# Patient Record
Sex: Female | Born: 1945
Health system: Southern US, Community
[De-identification: ages and names within clinical notes are randomized; demographics above are authoritative.]

## PROBLEM LIST (undated history)

## (undated) DIAGNOSIS — H3581 Retinal edema: Secondary | ICD-10-CM

## (undated) DIAGNOSIS — I10 Essential (primary) hypertension: Secondary | ICD-10-CM

## (undated) DIAGNOSIS — M810 Age-related osteoporosis without current pathological fracture: Secondary | ICD-10-CM

## (undated) HISTORY — PX: APPENDECTOMY: SHX54

## (undated) HISTORY — PX: ABDOMINAL HYSTERECTOMY: SHX81

---

## 2005-01-07 ENCOUNTER — Emergency Department (HOSPITAL_COMMUNITY): Admission: EM | Admit: 2005-01-07 | Discharge: 2005-01-07 | Payer: Self-pay | Admitting: Family Medicine

## 2007-05-27 ENCOUNTER — Encounter: Admission: RE | Admit: 2007-05-27 | Discharge: 2007-05-27 | Payer: Self-pay | Admitting: Internal Medicine

## 2007-12-24 HISTORY — PX: ABDOMINAL ADHESION SURGERY: SHX90

## 2008-04-30 ENCOUNTER — Inpatient Hospital Stay (HOSPITAL_COMMUNITY): Admission: EM | Admit: 2008-04-30 | Discharge: 2008-05-09 | Payer: Self-pay | Admitting: Emergency Medicine

## 2008-10-18 ENCOUNTER — Inpatient Hospital Stay (HOSPITAL_COMMUNITY): Admission: EM | Admit: 2008-10-18 | Discharge: 2008-10-20 | Payer: Self-pay | Admitting: *Deleted

## 2009-04-06 ENCOUNTER — Encounter: Admission: RE | Admit: 2009-04-06 | Discharge: 2009-04-06 | Payer: Self-pay | Admitting: Internal Medicine

## 2010-04-11 ENCOUNTER — Encounter: Admission: RE | Admit: 2010-04-11 | Discharge: 2010-04-11 | Payer: Self-pay | Admitting: Internal Medicine

## 2010-11-12 ENCOUNTER — Ambulatory Visit (HOSPITAL_COMMUNITY)
Admission: RE | Admit: 2010-11-12 | Discharge: 2010-11-12 | Payer: Self-pay | Source: Home / Self Care | Admitting: Internal Medicine

## 2011-04-03 ENCOUNTER — Other Ambulatory Visit: Payer: Self-pay | Admitting: Internal Medicine

## 2011-04-03 DIAGNOSIS — Z1231 Encounter for screening mammogram for malignant neoplasm of breast: Secondary | ICD-10-CM

## 2011-04-17 ENCOUNTER — Ambulatory Visit
Admission: RE | Admit: 2011-04-17 | Discharge: 2011-04-17 | Disposition: A | Discharge status: Home / Self Care | Payer: 59 | Source: Ambulatory Visit | Attending: Internal Medicine | Admitting: Internal Medicine

## 2011-04-17 DIAGNOSIS — Z1231 Encounter for screening mammogram for malignant neoplasm of breast: Secondary | ICD-10-CM

## 2011-05-07 NOTE — Op Note (Signed)
NAME:  [name], KUTSCH NO.:  [id number]   MEDICAL RECORD NO.:  [id number]          PATIENT TYPE:  INP   LOCATION:  1537                         FACILITY:  [facility]   PHYSICIAN:  Alfonse Ras, MD   DATE OF BIRTH:  1946-01-19   DATE OF PROCEDURE:  04/30/2008  DATE OF DISCHARGE:                               OPERATIVE REPORT   PREOPERATIVE DIAGNOSES:  1. Small bowel obstruction.  2. History of malrotations.   POSTOPERATIVE DIAGNOSES:  1. Small bowel obstruction.  2. History of malrotations.  3. Adhesions.   PROCEDURES:  Exploratory laparotomy and lysis of adhesions.   SURGEON:  Baruch Merl, MD   ANESTHESIA:  General.   ESTIMATED BLOOD LOSS:  50 mL or less.   COMPLICATIONS:  None.   SPECIMENS:  None.   DESCRIPTION:  The patient was taken to the operating room and placed in  supine position.  After adequate anesthesia was induced using  endotracheal tube, the abdomen was prepped and draped in normal sterile  fashion.  Using a vertical midline incision near the umbilicus, I  dissected down to fascia.  This was opened vertically.  Peritoneum was  entered.  There were virtually no adhesions to the anterior abdominal  wall.  There was a large loop of dilated bowel in the right lower  quadrant which was mobilized and a single thick adhesive band was lysed.  There were number of other adhesions between small bowel loops.  The  entire small bowel was run.  The duodenum, of note, was in the midline.  All of the small bowel was in the right abdomen and the majority of the  colon was in the left upper quadrant and left abdomen.  No other twist  in the mesentery or mesenteric defects were seen.  This accomplished  complete relief of the obstruction.  A few small venous bleeders were  oversewn with 2-0 silk ligatures.  The abdomen was copiously irrigated.  The fascia was closed with running #1 Novofil.  Skin was closed with  staples.  The patient tolerated the  procedure well, went to PACU in good  condition.     Alfonse Ras, MD  Electronically Signed    KRE/MEDQ  D:  04/30/2008  T:  05/01/2008  Job:  [id number]

## 2011-05-07 NOTE — H&P (Signed)
NAME:  [name], [name] NO.:  [id number]   MEDICAL RECORD NO.:  [id number]          PATIENT TYPE:  EMS   LOCATION:  ED                           FACILITY:  [facility]   PHYSICIAN:  Allison Ras, MD   DATE OF BIRTH:  08-13-1946   DATE OF ADMISSION:  10/18/2008  DATE OF DISCHARGE:                              HISTORY & PHYSICAL   ADMISSION DIAGNOSIS:  Partial small bowel obstruction, history of midgut  malrotation.   ADMITTING PHYSICIAN:  [name], M.D.   HISTORY OF PRESENT ILLNESS:  The patient is a very pleasant 65 year old  white female with about a 24-hour history of vague, right upper quadrant  and left lower quadrant abdominal discomfort, some mild distention, and  nausea and vomiting last night.  She is seen in emergency room,  underwent a KUB which was consistent with partial small-bowel  obstruction.  The patient has been having bowel movements although not  completely normal over the last number of days and continues to pass  gas.   PAST MEDICAL HISTORY:  Significant for hypertension and osteoporosis.   MEDICATIONS:  Norvasc 2.5 mg a day, Micardis 80/12.5 mg a day.   REVIEW OF SYSTEMS:  Significant as above.   PAST SURGICAL HISTORY:  Significant for exploratory surgery number of  years ago where she was told that she had malrotation and then most  recently in May of this year by myself for lysis of adhesions.   PHYSICAL EXAMINATION:  GENERAL:  On physical exam, she is an age-  appropriate white female in no distress.  VITAL SIGNS:  Her blood  pressure is 136/80, heart rate is 105, and respiratory rate is 16.  HEENT EXAM:  Benign.  Normocephalic, atraumatic.  ABDOMINAL EXAM:  Soft,  mildly distended, and somewhat tympanitic, but really not tender.  EXTREMITY EXAM:  No clubbing, cyanosis, or edema.  Rectal exam is  deferred.   LABORATORY DATA:  White count of 17,000.  C-Met is within normal limits  except for chloride of 90 and glucose of  177.   IMPRESSION:  Partial small-bowel obstruction.   PLAN:  Admission, IV hydration and NG tube decompression and follow  serial exams and KUB in the morning.      Allison Ras, MD  Electronically Signed    KRE/MEDQ  D:  10/18/2008  T:  10/18/2008  Job:  [id number]

## 2011-05-07 NOTE — Discharge Summary (Signed)
NAME:  [name], [name] NO.:  [id number]   MEDICAL RECORD NO.:  [id number]          PATIENT TYPE:  INP   LOCATION:  1537                         FACILITY:  [facility]   PHYSICIAN:  Alfonse Ras, MD   DATE OF BIRTH:  1946-01-12   DATE OF ADMISSION:  04/30/2008  DATE OF DISCHARGE:  05/09/2008                               DISCHARGE SUMMARY   ADMISSION DIAGNOSIS:  Small bowel obstruction.   DISCHARGE DIAGNOSIS:  Small bowel obstruction.   CONDITION ON DISCHARGE:  Good and improved.   DISPOSITION:  Discharged to home.   MEDICATIONS:  1. Vicodin for pain p.r.n.  2. Protonix 40 mg 1 orally everyday.  3. Norvasc 2.5 mg a day.  4. Micardis 80 mg a day.   HOSPITAL COURSE:  The patient was admitted with a small bowel  obstruction, secondary to adhesions.  She was taken to the operating  room and underwent lysis of adhesions.  Postoperatively, she did well  and took number of days for ileus to resolve.  However, by postoperative  day #7, she was tolerating a regular diet having normal bowel movements.  Incision was well healed.  Staples were removed and she was ready for  discharge home.      Alfonse Ras, MD  Electronically Signed     KRE/MEDQ  D:  05/09/2008  T:  05/09/2008  Job:  [id number]   cc:   Jola Babinski, M.D.

## 2011-05-07 NOTE — H&P (Signed)
NAME:  [name], [name] NO.:  [id number]   MEDICAL RECORD NO.:  [id number]          PATIENT TYPE:  EMS   LOCATION:  ED                           [facility]ACILITY:  [facility]   PHYSICIAN:  Alfonse Ras, MD   DATE OF BIRTH:  [birth year]   DATE OF ADMISSION:  04/30/2008  DATE OF DISCHARGE:                              HISTORY & PHYSICAL   ADMISSION DIAGNOSES:  Small bowel obstruction, history of midgut  malrotation.   ADMITTING PHYSICIAN:  Alfonse Ras, MD   HISTORY OF PRESENT ILLNESS:  The patient is a very pleasant 65 year old  white female, wife of Dr. Syliva Overman, with 5-day history of vague  abdominal pain, nausea without emesis, and last bowel movement was 4  days ago.  She has been complaining of obstipation over the last 3 days.  She has significant anorexia.  Her significant medical history is that  of an emergency hysterectomy in 1981 and apparent fixation of her  malrotation in 24 in [place], [place].  The patient has had no prior  episodes of small bowel obstruction since 1984.  She denies fever or  chills.   PAST MEDICAL HISTORY:  Is significant for hypertension and osteoporosis.   MEDICATIONS:  Include Norvasc at 2.5 mg a day and Micardis 80/12.5 mg  p.o. daily.   REVIEW OF SYSTEMS:  Significant as above.   PHYSICAL EXAMINATION:  On physical exam, she is an age-appropriate white  female in no distress.  She is accompanied by her husband.  Blood  pressure is 137/79, heart rate is 107, respiratory rate is 20.  HEENT EXAM:  Benign.  Normocephalic and atraumatic.  Pupils are equal,  round, reactive to light.  ABDOMEN:  Soft but somewhat distended and tympanitic.  She is tender in  the right lower quadrant without significant rebound.  EXTREMITIES:  Show no clubbing, cyanosis or edema.  RECTAL EXAM:  Deferred.   CT scan of the abdomen shows a swirl sign of the mesentery and multiple  dilated loops of small bowel.  All of the colon was on the left  side of  the abdomen and the duodenum does not slip across ligament of Treitz,  all consistent with malrotation and small bowel obstruction.   LABS:  White count is 11.6, otherwise electrolytes are within normal  limits except for a sodium of 129 and a glucose of 147.   IMPRESSION:  Small bowel obstruction most likely secondary to adhesions  with a history of malrotation.   PLAN:  Admission, IV hydration, exploratory laparotomy with possible  small bowel resection and possible colostomy.   I explained the risks, benefits and options to the patient and her  husband including the possibility of colostomy and resection and length  of stay in the hospital.  We will proceed with this as an open  exploratory laparotomy.  All the patient's questions are answered and  they wish to proceed.      Alfonse Ras, MD  Electronically Signed     KRE/MEDQ  D:  04/30/2008  T:  05/01/2008  Job:  [id number]

## 2011-05-07 NOTE — Discharge Summary (Signed)
NAME:  [name], POLIO NO.:  [id number]   MEDICAL RECORD NO.:  [id number]          PATIENT TYPE:  INP   LOCATION:  1312                         [facility]AC[facility]   PHYSICIAN:  Alfonse Ras, MD   DATE OF BIRTH:  [birth year]   DATE OF ADMISSION:  10/18/2008  DATE OF DISCHARGE:  10/20/2008                               DISCHARGE SUMMARY   ADMISSION DIAGNOSIS:  Partial small bowel obstruction.   FINAL DIAGNOSIS:  Partial small bowel obstruction resolved with  conservative management.   HOSPITAL COURSE:  The patient is a 65 year old white female known to me  in the past for lysis of adhesions in May of this year who presented  with partial small bowel obstruction.  She was admitted, NG tube was  placed and within 12-14 hours NG tube had put out minimal amounts.  The  patient was passing gas.  Her abdomen was much, much softer.  NG tube  was removed.  She was started on clear liquids which she tolerated well  for over 24 hours.  Had multiple small bowel movements and was ready for  discharge home.  There is no plan for followup with me.  She can call me  p.r.n.      Alfonse Ras, MD  Electronically Signed     KRE/MEDQ  D:  10/20/2008  T:  10/20/2008  Job:  [id number]   cc:   Soyla Murphy. Renne Crigler, M.D.  Fax: [phone]

## 2011-09-18 LAB — BASIC METABOLIC PANEL
CO2: 26
Calcium: 8.3 — ABNORMAL LOW
Glucose, Bld: 117 — ABNORMAL HIGH
Sodium: 130 — ABNORMAL LOW

## 2011-09-18 LAB — URINALYSIS, ROUTINE W REFLEX MICROSCOPIC
Bilirubin Urine: NEGATIVE
Hgb urine dipstick: NEGATIVE
Ketones, ur: NEGATIVE
Protein, ur: NEGATIVE
Urobilinogen, UA: 1

## 2011-09-18 LAB — CBC
HCT: 31.1 — ABNORMAL LOW
Hemoglobin: 10.6 — ABNORMAL LOW
MCHC: 34
RDW: 12.9

## 2011-09-18 LAB — URINE CULTURE

## 2011-09-23 LAB — URINALYSIS, ROUTINE W REFLEX MICROSCOPIC
Glucose, UA: NEGATIVE
Ketones, ur: 40 — AB
Protein, ur: 30 — AB

## 2011-09-23 LAB — DIFFERENTIAL
Basophils Relative: 0
Eosinophils Absolute: 0
Eosinophils Relative: 0
Monocytes Relative: 3
Neutrophils Relative %: 94 — ABNORMAL HIGH

## 2011-09-23 LAB — COMPREHENSIVE METABOLIC PANEL
ALT: 37 — ABNORMAL HIGH
AST: 41 — ABNORMAL HIGH
Alkaline Phosphatase: 79
CO2: 32
GFR calc non Af Amer: >60
Glucose, Bld: 177 — ABNORMAL HIGH
Potassium: 3.7
Sodium: 136
Total Protein: 8.2

## 2011-09-23 LAB — BASIC METABOLIC PANEL
Calcium: 7.9 — ABNORMAL LOW
Creatinine, Ser: 0.52
GFR calc Af Amer: >60
GFR calc non Af Amer: >60
Sodium: 135

## 2011-09-23 LAB — CBC
Hemoglobin: 10.8 — ABNORMAL LOW
Hemoglobin: 15
RBC: 3.3 — ABNORMAL LOW
RBC: 4.62
WBC: 4.7

## 2011-09-23 LAB — URINE MICROSCOPIC-ADD ON

## 2011-09-23 LAB — AMYLASE: Amylase: 52

## 2012-05-13 ENCOUNTER — Other Ambulatory Visit: Payer: Self-pay | Admitting: Internal Medicine

## 2012-05-13 DIAGNOSIS — Z1231 Encounter for screening mammogram for malignant neoplasm of breast: Secondary | ICD-10-CM

## 2012-06-01 ENCOUNTER — Ambulatory Visit
Admission: RE | Admit: 2012-06-01 | Discharge: 2012-06-01 | Disposition: A | Discharge status: Home / Self Care | Payer: Medicare Other | Source: Ambulatory Visit | Attending: Internal Medicine | Admitting: Internal Medicine

## 2012-06-01 DIAGNOSIS — Z1231 Encounter for screening mammogram for malignant neoplasm of breast: Secondary | ICD-10-CM

## 2013-05-28 ENCOUNTER — Other Ambulatory Visit: Payer: Self-pay

## 2013-05-28 DIAGNOSIS — Z1231 Encounter for screening mammogram for malignant neoplasm of breast: Secondary | ICD-10-CM

## 2013-06-24 ENCOUNTER — Ambulatory Visit
Admission: RE | Admit: 2013-06-24 | Discharge: 2013-06-24 | Disposition: A | Discharge status: Home / Self Care | Payer: Medicare Other | Source: Ambulatory Visit

## 2013-06-24 DIAGNOSIS — Z1231 Encounter for screening mammogram for malignant neoplasm of breast: Secondary | ICD-10-CM

## 2014-07-11 ENCOUNTER — Other Ambulatory Visit: Payer: Self-pay

## 2014-07-11 DIAGNOSIS — Z1231 Encounter for screening mammogram for malignant neoplasm of breast: Secondary | ICD-10-CM

## 2014-07-19 ENCOUNTER — Encounter (INDEPENDENT_AMBULATORY_CARE_PROVIDER_SITE_OTHER): Payer: Self-pay

## 2014-07-19 ENCOUNTER — Ambulatory Visit
Admission: RE | Admit: 2014-07-19 | Discharge: 2014-07-19 | Disposition: A | Discharge status: Home / Self Care | Payer: Medicare Other | Source: Ambulatory Visit

## 2014-07-19 DIAGNOSIS — Z1231 Encounter for screening mammogram for malignant neoplasm of breast: Secondary | ICD-10-CM

## 2014-10-07 ENCOUNTER — Other Ambulatory Visit: Payer: Self-pay

## 2015-06-19 ENCOUNTER — Other Ambulatory Visit: Payer: Self-pay

## 2015-07-04 ENCOUNTER — Other Ambulatory Visit: Payer: Self-pay

## 2015-07-04 DIAGNOSIS — Z1231 Encounter for screening mammogram for malignant neoplasm of breast: Secondary | ICD-10-CM

## 2015-07-24 ENCOUNTER — Ambulatory Visit
Admission: RE | Admit: 2015-07-24 | Discharge: 2015-07-24 | Disposition: A | Discharge status: Home / Self Care | Payer: Medicare Other | Source: Ambulatory Visit

## 2015-07-24 DIAGNOSIS — Z1231 Encounter for screening mammogram for malignant neoplasm of breast: Secondary | ICD-10-CM

## 2016-01-02 DIAGNOSIS — H35042 Retinal micro-aneurysms, unspecified, left eye: Secondary | ICD-10-CM | POA: Diagnosis not present

## 2016-01-02 DIAGNOSIS — H34832 Tributary (branch) retinal vein occlusion, left eye, with macular edema: Secondary | ICD-10-CM | POA: Diagnosis not present

## 2016-01-02 DIAGNOSIS — H35352 Cystoid macular degeneration, left eye: Secondary | ICD-10-CM | POA: Diagnosis not present

## 2016-02-08 DIAGNOSIS — H34832 Tributary (branch) retinal vein occlusion, left eye, with macular edema: Secondary | ICD-10-CM | POA: Diagnosis not present

## 2016-02-08 DIAGNOSIS — H35352 Cystoid macular degeneration, left eye: Secondary | ICD-10-CM | POA: Diagnosis not present

## 2016-03-14 DIAGNOSIS — H34832 Tributary (branch) retinal vein occlusion, left eye, with macular edema: Secondary | ICD-10-CM | POA: Diagnosis not present

## 2016-04-18 DIAGNOSIS — H34832 Tributary (branch) retinal vein occlusion, left eye, with macular edema: Secondary | ICD-10-CM | POA: Diagnosis not present

## 2016-05-23 DIAGNOSIS — H35042 Retinal micro-aneurysms, unspecified, left eye: Secondary | ICD-10-CM | POA: Diagnosis not present

## 2016-05-23 DIAGNOSIS — H34832 Tributary (branch) retinal vein occlusion, left eye, with macular edema: Secondary | ICD-10-CM | POA: Diagnosis not present

## 2016-06-12 DIAGNOSIS — Z23 Encounter for immunization: Secondary | ICD-10-CM | POA: Diagnosis not present

## 2016-06-27 DIAGNOSIS — H34832 Tributary (branch) retinal vein occlusion, left eye, with macular edema: Secondary | ICD-10-CM | POA: Diagnosis not present

## 2016-07-15 DIAGNOSIS — Z0000 Encounter for general adult medical examination without abnormal findings: Secondary | ICD-10-CM | POA: Diagnosis not present

## 2016-07-15 DIAGNOSIS — I10 Essential (primary) hypertension: Secondary | ICD-10-CM | POA: Diagnosis not present

## 2016-07-15 DIAGNOSIS — E7800 Pure hypercholesterolemia, unspecified: Secondary | ICD-10-CM | POA: Diagnosis not present

## 2016-07-15 DIAGNOSIS — I7300 Raynaud's syndrome without gangrene: Secondary | ICD-10-CM | POA: Diagnosis not present

## 2016-07-23 DIAGNOSIS — Z23 Encounter for immunization: Secondary | ICD-10-CM | POA: Diagnosis not present

## 2016-07-23 DIAGNOSIS — Z1212 Encounter for screening for malignant neoplasm of rectum: Secondary | ICD-10-CM | POA: Diagnosis not present

## 2016-07-23 DIAGNOSIS — I7300 Raynaud's syndrome without gangrene: Secondary | ICD-10-CM | POA: Diagnosis not present

## 2016-07-23 DIAGNOSIS — E7800 Pure hypercholesterolemia, unspecified: Secondary | ICD-10-CM | POA: Diagnosis not present

## 2016-07-23 DIAGNOSIS — M8580 Other specified disorders of bone density and structure, unspecified site: Secondary | ICD-10-CM | POA: Diagnosis not present

## 2016-07-23 DIAGNOSIS — L570 Actinic keratosis: Secondary | ICD-10-CM | POA: Diagnosis not present

## 2016-07-23 DIAGNOSIS — Z0000 Encounter for general adult medical examination without abnormal findings: Secondary | ICD-10-CM | POA: Diagnosis not present

## 2016-07-25 ENCOUNTER — Other Ambulatory Visit: Payer: Self-pay | Admitting: Internal Medicine

## 2016-07-25 DIAGNOSIS — Z1231 Encounter for screening mammogram for malignant neoplasm of breast: Secondary | ICD-10-CM

## 2016-07-25 DIAGNOSIS — H34832 Tributary (branch) retinal vein occlusion, left eye, with macular edema: Secondary | ICD-10-CM | POA: Diagnosis not present

## 2016-07-30 DIAGNOSIS — M810 Age-related osteoporosis without current pathological fracture: Secondary | ICD-10-CM | POA: Diagnosis not present

## 2016-08-05 ENCOUNTER — Ambulatory Visit
Admission: RE | Admit: 2016-08-05 | Discharge: 2016-08-05 | Disposition: A | Discharge status: Home / Self Care | Payer: Medicare Other | Source: Ambulatory Visit | Attending: Internal Medicine | Admitting: Internal Medicine

## 2016-08-05 DIAGNOSIS — Z1231 Encounter for screening mammogram for malignant neoplasm of breast: Secondary | ICD-10-CM | POA: Diagnosis not present

## 2016-08-07 DIAGNOSIS — H40013 Open angle with borderline findings, low risk, bilateral: Secondary | ICD-10-CM | POA: Diagnosis not present

## 2016-08-07 DIAGNOSIS — H35033 Hypertensive retinopathy, bilateral: Secondary | ICD-10-CM | POA: Diagnosis not present

## 2016-08-07 DIAGNOSIS — I708 Atherosclerosis of other arteries: Secondary | ICD-10-CM | POA: Diagnosis not present

## 2016-08-07 DIAGNOSIS — H25013 Cortical age-related cataract, bilateral: Secondary | ICD-10-CM | POA: Diagnosis not present

## 2016-08-29 DIAGNOSIS — H34832 Tributary (branch) retinal vein occlusion, left eye, with macular edema: Secondary | ICD-10-CM | POA: Diagnosis not present

## 2016-09-24 DIAGNOSIS — Z23 Encounter for immunization: Secondary | ICD-10-CM | POA: Diagnosis not present

## 2016-10-03 DIAGNOSIS — H34832 Tributary (branch) retinal vein occlusion, left eye, with macular edema: Secondary | ICD-10-CM | POA: Diagnosis not present

## 2016-11-04 DIAGNOSIS — H34832 Tributary (branch) retinal vein occlusion, left eye, with macular edema: Secondary | ICD-10-CM | POA: Diagnosis not present

## 2016-11-04 DIAGNOSIS — H35352 Cystoid macular degeneration, left eye: Secondary | ICD-10-CM | POA: Diagnosis not present

## 2016-11-19 DIAGNOSIS — M810 Age-related osteoporosis without current pathological fracture: Secondary | ICD-10-CM | POA: Diagnosis not present

## 2016-12-10 DIAGNOSIS — H34832 Tributary (branch) retinal vein occlusion, left eye, with macular edema: Secondary | ICD-10-CM | POA: Diagnosis not present

## 2017-01-14 DIAGNOSIS — H34832 Tributary (branch) retinal vein occlusion, left eye, with macular edema: Secondary | ICD-10-CM | POA: Diagnosis not present

## 2017-01-20 DIAGNOSIS — M810 Age-related osteoporosis without current pathological fracture: Secondary | ICD-10-CM | POA: Diagnosis not present

## 2017-02-17 DIAGNOSIS — M810 Age-related osteoporosis without current pathological fracture: Secondary | ICD-10-CM | POA: Diagnosis not present

## 2017-02-18 DIAGNOSIS — H34832 Tributary (branch) retinal vein occlusion, left eye, with macular edema: Secondary | ICD-10-CM | POA: Diagnosis not present

## 2017-04-02 DIAGNOSIS — H40013 Open angle with borderline findings, low risk, bilateral: Secondary | ICD-10-CM | POA: Diagnosis not present

## 2017-04-02 DIAGNOSIS — H04123 Dry eye syndrome of bilateral lacrimal glands: Secondary | ICD-10-CM | POA: Diagnosis not present

## 2017-04-02 DIAGNOSIS — L718 Other rosacea: Secondary | ICD-10-CM | POA: Diagnosis not present

## 2017-04-02 DIAGNOSIS — H01009 Unspecified blepharitis unspecified eye, unspecified eyelid: Secondary | ICD-10-CM | POA: Diagnosis not present

## 2017-04-07 DIAGNOSIS — H34832 Tributary (branch) retinal vein occlusion, left eye, with macular edema: Secondary | ICD-10-CM | POA: Diagnosis not present

## 2017-05-26 DIAGNOSIS — H34832 Tributary (branch) retinal vein occlusion, left eye, with macular edema: Secondary | ICD-10-CM | POA: Diagnosis not present

## 2017-05-26 DIAGNOSIS — H35042 Retinal micro-aneurysms, unspecified, left eye: Secondary | ICD-10-CM | POA: Diagnosis not present

## 2017-05-26 DIAGNOSIS — H359 Unspecified retinal disorder: Secondary | ICD-10-CM | POA: Diagnosis not present

## 2017-05-26 DIAGNOSIS — H35352 Cystoid macular degeneration, left eye: Secondary | ICD-10-CM | POA: Diagnosis not present

## 2017-06-30 DIAGNOSIS — M810 Age-related osteoporosis without current pathological fracture: Secondary | ICD-10-CM | POA: Diagnosis not present

## 2017-07-10 ENCOUNTER — Observation Stay (HOSPITAL_COMMUNITY): Payer: Medicare HMO

## 2017-07-10 ENCOUNTER — Emergency Department (HOSPITAL_COMMUNITY): Payer: Medicare HMO

## 2017-07-10 ENCOUNTER — Encounter (HOSPITAL_COMMUNITY): Payer: Self-pay | Admitting: Emergency Medicine

## 2017-07-10 ENCOUNTER — Inpatient Hospital Stay (HOSPITAL_COMMUNITY)
Admission: EM | Admit: 2017-07-10 | Discharge: 2017-07-20 | DRG: 337 | Disposition: A | Discharge status: Home / Self Care | Payer: Medicare HMO | Attending: General Surgery | Admitting: General Surgery

## 2017-07-10 DIAGNOSIS — K8020 Calculus of gallbladder without cholecystitis without obstruction: Secondary | ICD-10-CM | POA: Diagnosis not present

## 2017-07-10 DIAGNOSIS — K565 Intestinal adhesions [bands], unspecified as to partial versus complete obstruction: Secondary | ICD-10-CM | POA: Diagnosis not present

## 2017-07-10 DIAGNOSIS — Z4682 Encounter for fitting and adjustment of non-vascular catheter: Secondary | ICD-10-CM | POA: Diagnosis not present

## 2017-07-10 DIAGNOSIS — K56609 Unspecified intestinal obstruction, unspecified as to partial versus complete obstruction: Secondary | ICD-10-CM | POA: Diagnosis present

## 2017-07-10 DIAGNOSIS — I10 Essential (primary) hypertension: Secondary | ICD-10-CM | POA: Diagnosis present

## 2017-07-10 DIAGNOSIS — Z8719 Personal history of other diseases of the digestive system: Secondary | ICD-10-CM | POA: Diagnosis not present

## 2017-07-10 DIAGNOSIS — Z79899 Other long term (current) drug therapy: Secondary | ICD-10-CM

## 2017-07-10 DIAGNOSIS — K5651 Intestinal adhesions [bands], with partial obstruction: Secondary | ICD-10-CM | POA: Diagnosis not present

## 2017-07-10 DIAGNOSIS — K5669 Other partial intestinal obstruction: Secondary | ICD-10-CM | POA: Diagnosis not present

## 2017-07-10 DIAGNOSIS — E876 Hypokalemia: Secondary | ICD-10-CM | POA: Diagnosis not present

## 2017-07-10 DIAGNOSIS — Z431 Encounter for attention to gastrostomy: Secondary | ICD-10-CM | POA: Diagnosis not present

## 2017-07-10 DIAGNOSIS — H3581 Retinal edema: Secondary | ICD-10-CM | POA: Diagnosis present

## 2017-07-10 DIAGNOSIS — Z4659 Encounter for fitting and adjustment of other gastrointestinal appliance and device: Secondary | ICD-10-CM

## 2017-07-10 DIAGNOSIS — R140 Abdominal distension (gaseous): Secondary | ICD-10-CM | POA: Diagnosis not present

## 2017-07-10 DIAGNOSIS — K5900 Constipation, unspecified: Secondary | ICD-10-CM | POA: Diagnosis not present

## 2017-07-10 DIAGNOSIS — Z9889 Other specified postprocedural states: Secondary | ICD-10-CM

## 2017-07-10 DIAGNOSIS — R112 Nausea with vomiting, unspecified: Secondary | ICD-10-CM | POA: Diagnosis not present

## 2017-07-10 DIAGNOSIS — M810 Age-related osteoporosis without current pathological fracture: Secondary | ICD-10-CM | POA: Diagnosis present

## 2017-07-10 DIAGNOSIS — Z8249 Family history of ischemic heart disease and other diseases of the circulatory system: Secondary | ICD-10-CM

## 2017-07-10 DIAGNOSIS — K56699 Other intestinal obstruction unspecified as to partial versus complete obstruction: Secondary | ICD-10-CM | POA: Diagnosis not present

## 2017-07-10 DIAGNOSIS — F5089 Other specified eating disorder: Secondary | ICD-10-CM | POA: Diagnosis not present

## 2017-07-10 DIAGNOSIS — Z90710 Acquired absence of both cervix and uterus: Secondary | ICD-10-CM

## 2017-07-10 HISTORY — DX: Retinal edema: H35.81

## 2017-07-10 HISTORY — DX: Age-related osteoporosis without current pathological fracture: M81.0

## 2017-07-10 HISTORY — DX: Essential (primary) hypertension: I10

## 2017-07-10 LAB — COMPREHENSIVE METABOLIC PANEL
ALT: 24 U/L (ref 14–54)
ANION GAP: 14 (ref 5–15)
AST: 39 U/L (ref 15–41)
Albumin: 4.9 g/dL (ref 3.5–5.0)
Alkaline Phosphatase: 56 U/L (ref 38–126)
BUN: 39 mg/dL — ABNORMAL HIGH (ref 6–20)
CHLORIDE: 86 mmol/L — AB (ref 101–111)
CO2: 29 mmol/L (ref 22–32)
Calcium: 9.1 mg/dL (ref 8.9–10.3)
Creatinine, Ser: 1.16 mg/dL — ABNORMAL HIGH (ref 0.44–1.00)
GFR calc non Af Amer: 46 mL/min — ABNORMAL LOW (ref >60)
GFR, EST AFRICAN AMERICAN: 53 mL/min — AB (ref >60)
Glucose, Bld: 114 mg/dL — ABNORMAL HIGH (ref 65–99)
POTASSIUM: 3.5 mmol/L (ref 3.5–5.1)
SODIUM: 129 mmol/L — AB (ref 135–145)
Total Bilirubin: 3 mg/dL — ABNORMAL HIGH (ref 0.3–1.2)
Total Protein: 8.5 g/dL — ABNORMAL HIGH (ref 6.5–8.1)

## 2017-07-10 LAB — URINALYSIS, ROUTINE W REFLEX MICROSCOPIC
Glucose, UA: NEGATIVE mg/dL
KETONES UR: 20 mg/dL — AB
Nitrite: NEGATIVE
PH: 5 (ref 5.0–8.0)
Protein, ur: 30 mg/dL — AB
Specific Gravity, Urine: 1.021 (ref 1.005–1.030)

## 2017-07-10 LAB — CBC
HCT: 41.5 % (ref 36.0–46.0)
HEMATOCRIT: 38.1 % (ref 36.0–46.0)
HEMOGLOBIN: 13.5 g/dL (ref 12.0–15.0)
Hemoglobin: 14.8 g/dL (ref 12.0–15.0)
MCH: 33.1 pg (ref 26.0–34.0)
MCH: 32.8 pg (ref 26.0–34.0)
MCHC: 35.7 g/dL (ref 30.0–36.0)
MCHC: 35.4 g/dL (ref 30.0–36.0)
MCV: 92.8 fL (ref 78.0–100.0)
MCV: 92.5 fL (ref 78.0–100.0)
Platelets: 299 10*3/uL (ref 150–400)
Platelets: 259 10*3/uL (ref 150–400)
RBC: 4.47 MIL/uL (ref 3.87–5.11)
RBC: 4.12 MIL/uL (ref 3.87–5.11)
RDW: 12.5 % (ref 11.5–15.5)
RDW: 12.4 % (ref 11.5–15.5)
WBC: 8.9 10*3/uL (ref 4.0–10.5)
WBC: 8.2 10*3/uL (ref 4.0–10.5)

## 2017-07-10 LAB — PROTIME-INR
INR: 1.06
PROTHROMBIN TIME: 13.8 s (ref 11.4–15.2)

## 2017-07-10 LAB — CREATININE, SERUM
Creatinine, Ser: 0.78 mg/dL (ref 0.44–1.00)
GFR calc non Af Amer: >60 mL/min (ref >60)

## 2017-07-10 LAB — LIPASE, BLOOD: LIPASE: 38 U/L (ref 11–51)

## 2017-07-10 LAB — TROPONIN I: Troponin I: <0.03 ng/mL (ref <0.03)

## 2017-07-10 LAB — APTT: APTT: 27 s (ref 24–36)

## 2017-07-10 MED ORDER — ONDANSETRON 4 MG PO TBDP: 4.0000 mg | ORAL_TABLET | Freq: Four times a day (QID) | ORAL | Status: DC | PRN

## 2017-07-10 MED ORDER — LACTATED RINGERS IV BOLUS (SEPSIS)
1000.0000 mL | Freq: Once | INTRAVENOUS | Status: AC
  Administered 2017-07-10: 1000 mL via INTRAVENOUS

## 2017-07-10 MED ORDER — SODIUM CHLORIDE 0.9 % IV SOLN
INTRAVENOUS | Status: DC
  Administered 2017-07-10 – 2017-07-11 (×3): via INTRAVENOUS

## 2017-07-10 MED ORDER — IOPAMIDOL (ISOVUE-300) INJECTION 61%
100.0000 mL | Freq: Once | INTRAVENOUS | Status: AC | PRN
  Administered 2017-07-10: 100 mL via INTRAVENOUS

## 2017-07-10 MED ORDER — HEPARIN SODIUM (PORCINE) 5000 UNIT/ML IJ SOLN
5000.0000 [IU] | Freq: Three times a day (TID) | INTRAMUSCULAR | Status: DC
  Filled 2017-07-10 (×3): qty 1

## 2017-07-10 MED ORDER — MORPHINE SULFATE (PF) 2 MG/ML IV SOLN: 1.0000 mg | INTRAVENOUS | Status: DC | PRN

## 2017-07-10 MED ORDER — IOPAMIDOL (ISOVUE-300) INJECTION 61%
30.0000 mL | Freq: Once | INTRAVENOUS | Status: AC | PRN
Start: 2017-07-10 — End: 2017-07-10
  Administered 2017-07-10: 30 mL via ORAL

## 2017-07-10 MED ORDER — DIPHENHYDRAMINE HCL 12.5 MG/5ML PO ELIX: 12.5000 mg | ORAL_SOLUTION | Freq: Four times a day (QID) | ORAL | Status: DC | PRN

## 2017-07-10 MED ORDER — DIPHENHYDRAMINE HCL 50 MG/ML IJ SOLN
12.5000 mg | Freq: Four times a day (QID) | INTRAMUSCULAR | Status: DC | PRN
  Administered 2017-07-12: 12.5 mg via INTRAVENOUS
  Filled 2017-07-10: qty 1

## 2017-07-10 MED ORDER — IOPAMIDOL (ISOVUE-300) INJECTION 61%
INTRAVENOUS | Status: AC
  Filled 2017-07-10: qty 100

## 2017-07-10 MED ORDER — LIDOCAINE HCL 2 % EX GEL
1.0000 "application " | Freq: Once | CUTANEOUS | Status: AC
  Administered 2017-07-10: 1
  Filled 2017-07-10: qty 11

## 2017-07-10 MED ORDER — PHENOL 1.4 % MT LIQD
1.0000 | OROMUCOSAL | Status: DC | PRN
  Administered 2017-07-10: 1 via OROMUCOSAL
  Filled 2017-07-10: qty 177

## 2017-07-10 MED ORDER — ONDANSETRON HCL 4 MG/2ML IJ SOLN
4.0000 mg | Freq: Four times a day (QID) | INTRAMUSCULAR | Status: DC | PRN
  Administered 2017-07-11 – 2017-07-13 (×7): 4 mg via INTRAVENOUS
  Filled 2017-07-10 (×7): qty 2

## 2017-07-10 MED ORDER — FENTANYL CITRATE (PF) 100 MCG/2ML IJ SOLN: 50.0000 ug | INTRAMUSCULAR | Status: DC | PRN

## 2017-07-10 MED ORDER — ONDANSETRON HCL 4 MG/2ML IJ SOLN
4.0000 mg | Freq: Three times a day (TID) | INTRAMUSCULAR | Status: DC | PRN
  Administered 2017-07-10 (×2): 4 mg via INTRAVENOUS
  Filled 2017-07-10 (×2): qty 2

## 2017-07-10 MED ORDER — IOPAMIDOL (ISOVUE-300) INJECTION 61%
INTRAVENOUS | Status: AC
  Administered 2017-07-10: 30 mL via ORAL
  Filled 2017-07-10: qty 30

## 2017-07-10 NOTE — Progress Notes (Signed)
Pt's NG tube advance several cm at this time and check with ascultation and placement is correct. ABD xray ordered for placement confirmation. Will continue to monitor.

## 2017-07-10 NOTE — ED Notes (Signed)
Attempted to make first NG placement in R nare, pt tolerated moderately well but placement was not correct and it had to be removed.  Second NG placed, improved pt tolerance.  Verifying with XR.

## 2017-07-10 NOTE — ED Provider Notes (Signed)
[facility]-EMERGENCY DEPT Provider Note   CSN: [id number] Arrival date & time: 07/10/17  1115     History   Chief Complaint Chief Complaint  Patient presents with  . Abdominal Pain  . Nausea  . Emesis    HPI [name] is a 71 y.o. female.  HPI  71 year old female sent here from doctor's office secondary to likely small bowel obstruction. Patient is a history of the same. She's had a week of nausea vomiting and no gas or bowel movements. Since they were seen distention and abdominal pain during that time. No fevers. She has had adhesions in the past.  Past Medical History:  Diagnosis Date  . Hypertension   . Macular edema   . Osteoporosis     Patient Active Problem List   Diagnosis Date Noted  . SBO (small bowel obstruction) (HCC) 07/10/2017    Past Surgical History:  Procedure Laterality Date  . ABDOMINAL HYSTERECTOMY    . ABDOMINAL SURGERY    . APPENDECTOMY      OB History    No data available       Home Medications    Prior to Admission medications   Medication Sig Start Date End Date Taking? Authorizing Provider  Aflibercept (EYLEA) 2 MG/0.05ML SOLN 2 mg by Intravitreal route See admin instructions. Pt goes approximately every two months.   Yes [provider]  amLODipine (NORVASC) 5 MG tablet Take 2.5 mg by mouth daily.   Yes [provider]  bisacodyl (DULCOLAX) 10 MG suppository Place 10 mg rectally 2 (two) times daily as needed for moderate constipation.    Yes [provider]  Cholecalciferol (VITAMIN D3) 2000 units TABS Take 2,000 Units by mouth daily.   Yes [provider]  cycloSPORINE (RESTASIS) 0.05 % ophthalmic emulsion Place 1 drop into both eyes 2 (two) times daily.   Yes [provider]  denosumab (PROLIA) 60 MG/ML SOLN injection Inject 60 mg into the skin every 6 (six) months. Administer in upper arm, thigh, or abdomen   Yes [provider]  Magnesium 250 MG TABS Take 250 mg by mouth  daily.   Yes [provider]  metroNIDAZOLE (METROGEL) 0.75 % gel Apply 1 application topically 2 (two) times daily. Pt applies to face.   Yes [provider]  omeprazole (PRILOSEC) 20 MG capsule Take 20 mg by mouth daily as needed (for acid reflux).   Yes [provider]  ondansetron (ZOFRAN) 8 MG tablet Take 8 mg by mouth every 8 (eight) hours as needed for nausea or vomiting.   Yes [provider]  simethicone (MYLICON) 80 MG chewable tablet Chew 80 mg by mouth every 6 (six) hours as needed for flatulence.   Yes [provider]  telmisartan (MICARDIS) 80 MG tablet Take 80 mg by mouth daily.   Yes [provider]    Family History Family History  Problem Relation Age of Onset  . Stroke Mother   . Hypertension Mother   . Osteoarthritis Mother     Social History Social History  Substance Use Topics  . Smoking status: Never Smoker  . Smokeless tobacco: Never Used  . Alcohol use 0.6 oz/week    1 Glasses of wine per week     Comment: daily     Allergies   Patient has no known allergies.   Review of Systems Review of Systems  All other systems reviewed and are negative.    Physical Exam Updated Vital Signs BP (!) 156/92  Pulse 88   Resp (!) 25   Wt 50.3 kg (111 lb)   SpO2 100%   Physical Exam  Constitutional: She is oriented to person, place, and time. She appears well-developed and well-nourished.  HENT:  Head: Normocephalic and atraumatic.  Eyes: Conjunctivae and EOM are normal.  Neck: Normal range of motion.  Cardiovascular: Normal rate and regular rhythm.   Pulmonary/Chest: Effort normal. No stridor. No respiratory distress.  Abdominal: Soft. Bowel sounds are normal. She exhibits distension. There is tenderness. There is no guarding.  Musculoskeletal: Normal range of motion. She exhibits no edema or deformity.  Neurological: She is alert and oriented to person, place, and time. No cranial nerve deficit.  Coordination normal.  Skin: Skin is warm and dry.  Nursing note and vitals reviewed.    ED Treatments / Results  Labs (all labs ordered are listed, but only abnormal results are displayed) Labs Reviewed  COMPREHENSIVE METABOLIC PANEL - Abnormal; Notable for the following:       Result Value   Sodium 129 (*)    Chloride 86 (*)    Glucose, Bld 114 (*)    BUN 39 (*)    Creatinine, Ser 1.16 (*)    Total Protein 8.5 (*)    Total Bilirubin 3.0 (*)    GFR calc non Af Amer 46 (*)    GFR calc Af Amer 53 (*)    All other components within normal limits  URINALYSIS, ROUTINE W REFLEX MICROSCOPIC - Abnormal; Notable for the following:    Color, Urine AMBER (*)    APPearance CLOUDY (*)    Hgb urine dipstick SMALL (*)    Bilirubin Urine SMALL (*)    Ketones, ur 20 (*)    Protein, ur 30 (*)    Leukocytes, UA SMALL (*)    Bacteria, UA MANY (*)    Squamous Epithelial / LPF 0-5 (*)    All other components within normal limits  URINE CULTURE  LIPASE, BLOOD  CBC  TROPONIN I  CBC  CREATININE, SERUM  APTT  PROTIME-INR  BASIC METABOLIC PANEL  CBC    EKG  EKG Interpretation  Date/Time:  Thursday July 10 2017 12:57:02 EDT Ventricular Rate:  81 PR Interval:    QRS Duration: 86 QT Interval:  370 QTC Calculation: 430 R Axis:   -8 Text Interpretation:  Sinus rhythm Probable left atrial enlargement Low voltage, precordial leads Baseline wander in lead(s) V6 Confirmed by Marily Memos [phone]) on 07/10/2017 2:59:16 PM       Radiology Ct Abdomen Pelvis W Contrast  Result Date: 07/10/2017 CLINICAL DATA:  Abdominal discomfort with findings of small-bowel obstruction on recent plain film examination. EXAM: CT ABDOMEN AND PELVIS WITH CONTRAST TECHNIQUE: Multidetector CT imaging of the abdomen and pelvis was performed using the standard protocol following bolus administration of intravenous contrast. CONTRAST:  ISOVUE-300 IOPAMIDOL (ISOVUE-300) INJECTION 61%, 30mL ISOVUE-300 IOPAMIDOL  (ISOVUE-300) INJECTION 61% COMPARISON:  Plain film from earlier in the same day. FINDINGS: Lower chest: No acute abnormality. Hepatobiliary: Mild decreased attenuation is noted within the liver consistent with fatty infiltration. Some small dependent gallstones are noted. A small hepatic cyst is noted in the tip of the right lobe. Pancreas: Unremarkable. No pancreatic ductal dilatation or surrounding inflammatory changes. Spleen: Normal in size without focal abnormality. Adrenals/Urinary Tract: The adrenal glands are within normal limits. Kidneys demonstrate a normal enhancement pattern bilaterally. No renal calculi or obstructive changes are seen. The bladder is decompressed. Stomach/Bowel: There is significant small bowel  dilatation identified. A transition zone is noted in the mid abdomen best seen on image she is 46 and 47 of series to as well as image number 76 of series 4. Given the postsurgical changes in the abdomen this likely represents adhesions. No definitive mass lesion is identified. The colon is decompressed. No free fluid or free air is identified. Vascular/Lymphatic: Aortic atherosclerosis. No enlarged abdominal or pelvic lymph nodes. Reproductive: Status post hysterectomy. No adnexal masses. Other: No abdominal wall hernia or abnormality. No abdominopelvic ascites. Musculoskeletal: Degenerative changes of lumbar spine are noted. IMPRESSION: High-grade small bowel obstruction with a transition point in the mid abdomen as described likely related to postoperative Adhesions. Small gallstones without complicating factors. Chronic changes as described above without other acute abnormality. Electronically Signed   By: Alcide Clever M.D.   On: 07/10/2017 15:51   Dg Abd Portable 1v  Result Date: 07/10/2017 CLINICAL DATA:  NG tube placement. EXAM: PORTABLE ABDOMEN - 1 VIEW COMPARISON:  None. FINDINGS: Nasogastric tube curls in the distal esophagus. The tip is directed cephalad projecting in the mid  esophagus near the level of the carina. There is persistent bowel dilation consistent with obstruction. IMPRESSION: 1. Malpositioned nasogastric tube. Tube curls in the distal esophagus and does not enter the stomach. Electronically Signed   By: Amie Portland M.D.   On: 07/10/2017 17:24    Procedures Procedures (including critical care time)  Medications Ordered in ED Medications  fentaNYL (SUBLIMAZE) injection 50 mcg (not administered)  ondansetron (ZOFRAN) injection 4 mg (4 mg Intravenous Given 07/10/17 1241)  iopamidol (ISOVUE-300) 61 % injection (not administered)  heparin injection 5,000 Units (not administered)  0.9 %  sodium chloride infusion ( Intravenous New Bag/Given 07/10/17 1645)  morphine 2 MG/ML injection 1-2 mg (not administered)  diphenhydrAMINE (BENADRYL) 12.5 MG/5ML elixir 12.5 mg (not administered)    Or  diphenhydrAMINE (BENADRYL) injection 12.5 mg (not administered)  ondansetron (ZOFRAN-ODT) disintegrating tablet 4 mg (not administered)    Or  ondansetron (ZOFRAN) injection 4 mg (not administered)  lactated ringers bolus 1,000 mL (0 mLs Intravenous Stopped 07/10/17 1645)  iopamidol (ISOVUE-300) 61 % injection 30 mL (30 mLs Oral Contrast Given 07/10/17 1314)  iopamidol (ISOVUE-300) 61 % injection 100 mL (100 mLs Intravenous Contrast Given 07/10/17 1507)  lidocaine (XYLOCAINE) 2 % jelly 1 application (1 application Other Given 07/10/17 1650)  lidocaine (XYLOCAINE) 2 % jelly 1 application (1 application Other Given 07/10/17 1736)     Initial Impression / Assessment and Plan / ED Course  I have reviewed the triage vital signs and the nursing notes.  Pertinent labs & imaging results that were available during my care of the patient were reviewed by me and considered in my medical decision making (see chart for details).     SBO with dehydration but no sepsis or other e/o severe illness. Surgery consulted, will admit.   Final Clinical Impressions(s) / ED Diagnoses    Final diagnoses:  SBO (small bowel obstruction) (HCC)     Jensen Kilburg, Barbara Cower, MD 07/10/17 1744

## 2017-07-10 NOTE — Consult Note (Signed)
Reason for Consult:  SBO Referring Physician: Dr. Desiree LucyJ Messner PCP:  Merri BrunettePharr, Walter, MD  CC:  Abdominal pain, nausea, constipation, fatigue, and vomiting  [name] is an 71 y.o. female.   HPI: Pt reported no BM x 9 days, she frequently has issues with BM and traveling and she has been in [place]. She started having  increased abdominal pain and distension on 07/06/17.  She has had this before and it resolved on it's own.  Also complaining of nausea and vomiting on 7/15, but no nausea or vomiting since 07/06/17.  She hs been taking clears.  She is also complaining with fatigue now.  She was seen by Dr. Amanda CockaynePrevo and xray shows SBO.  [facility] is [facility] for [facility] evaluation, she has had Zofran, prilosec, and Gas x, none have relieved her symptoms.   She has a hx of congenital malrotation with SBO in 2009.  She underwent Exploartory laparotomy and lysis of adhesions  by Dr. Baruch MerlKristen Earle, 04/23/2008.  Post op she had a SBO shortly after surgery and it resolved with decompression.  No issues since that time till last week when she started having issues traveling in [place], she and her husband returned secondary to abdominal discomfort. Work up in the ED shows she is afebrile, VSS,  Na is 129, creatinine is up some  1/16, Bilirubin is up some at 3.0, other LFT's are normal.  Troponin and CBC are normal. She has not had a colonoscopy secondary to her malrotation.  CT scan shows:     High-grade small bowel obstruction with a transition point in the mid abdomen as described likely related to postoperative Adhesions. Small gallstones without complicating factors.   Past Medical History:  Diagnosis Date  . Hypertension   . Macular edema   . Osteoporosis     Past Surgical History:  Procedure Laterality Date  . ABDOMINAL HYSTERECTOMY    . ABDOMINAL SURGERY    . APPENDECTOMY      Family History  Problem Relation Age of Onset  . Stroke Mother   . Hypertension Mother   .  Osteoarthritis Mother     Social History:  reports that she has never smoked. She has never used smokeless tobacco. She reports that she drinks about 0.6 oz of alcohol per week . Her drug history is not on file.  Allergies: No Known Allergies  Prior to Admission medications   Medication Sig Start Date End Date Taking? Authorizing Provider  Aflibercept (EYLEA) 2 MG/0.05ML SOLN 2 mg by Intravitreal route See admin instructions. Pt goes approximately every two months.   Yes [provider]  amLODipine (NORVASC) 5 MG tablet Take 2.5 mg by mouth daily.   Yes [provider]  bisacodyl (DULCOLAX) 10 MG suppository Place 10 mg rectally 2 (two) times daily as needed for moderate constipation.    Yes [provider]  Cholecalciferol (VITAMIN D3) 2000 units TABS Take 2,000 Units by mouth daily.   Yes [provider]  cycloSPORINE (RESTASIS) 0.05 % ophthalmic emulsion Place 1 drop into both eyes 2 (two) times daily.   Yes [provider]  denosumab (PROLIA) 60 MG/ML SOLN injection Inject 60 mg into the skin every 6 (six) months. Administer in upper arm, thigh, or abdomen   Yes [provider]  Magnesium 250 MG TABS Take 250 mg by mouth daily.   Yes [provider]  metroNIDAZOLE (METROGEL) 0.75 % gel Apply 1 application topically 2 (two) times daily.  Pt applies to face.   Yes [provider]  omeprazole (PRILOSEC) 20 MG capsule Take 20 mg by mouth daily as needed (for acid reflux).   Yes [provider]  ondansetron (ZOFRAN) 8 MG tablet Take 8 mg by mouth every 8 (eight) hours as needed for nausea or vomiting.   Yes [provider]  simethicone (MYLICON) 80 MG chewable tablet Chew 80 mg by mouth every 6 (six) hours as needed for flatulence.   Yes [provider]  telmisartan (MICARDIS) 80 MG tablet Take 80 mg by mouth daily.   Yes [provider]      Results for orders placed or performed during  the hospital encounter of 07/10/17 (from the past 48 hour(s))  CBC     Status: None   Collection Time: 07/10/17 12:02 PM  Result Value Ref Range   WBC 8.9 4.0 - 10.5 K/uL   RBC 4.47 3.87 - 5.11 MIL/uL   Hemoglobin 14.8 12.0 - 15.0 g/dL   HCT 16.1 09.6 - 04.5 %   MCV 92.8 78.0 - 100.0 fL   MCH 33.1 26.0 - 34.0 pg   MCHC 35.7 30.0 - 36.0 g/dL   RDW 40.9 81.1 - 91.4 %   Platelets 299 150 - 400 K/uL    No results found.  Review of Systems  Constitutional: Positive for malaise/fatigue and weight loss (not sure). Negative for chills, diaphoresis and fever.  HENT: Negative.   Eyes: Negative.   Respiratory: Negative.   Cardiovascular: Negative.   Gastrointestinal: Positive for abdominal pain, constipation, heartburn, nausea and vomiting. Negative for blood in stool, diarrhea and melena.  Genitourinary: Negative.   Musculoskeletal: Negative.   Skin: Negative.   Neurological: Negative.  Negative for weakness.  Endo/Heme/Allergies: Negative.   Psychiatric/Behavioral: Negative.    Blood pressure 118/75, pulse 93, resp. rate 18, weight 50.3 kg (111 lb), SpO2 99 %. Physical Exam  Constitutional: She is oriented to person, place, and time. She appears well-developed and well-nourished. No distress.  HENT:  Head: Normocephalic and atraumatic.  Mouth/Throat: No oropharyngeal exudate.  Eyes: Right eye exhibits no discharge. Left eye exhibits no discharge. No scleral icterus.  Pupils are equal   Neck: Normal range of motion. Neck supple. No JVD present. No tracheal deviation present. No thyromegaly present.  Scar just at the mandible from a cyst as a child  Cardiovascular: Normal rate, regular rhythm, normal heart sounds and intact distal pulses.   No murmur heard. Respiratory: Effort normal and breath sounds normal. No respiratory distress. She has no wheezes. She has no rales. She exhibits no tenderness.  GI: Soft. She exhibits distension. She exhibits no mass. There is tenderness  (minimal tenderness currently). There is no rebound and no guarding.  Musculoskeletal: She exhibits no edema or tenderness.  Lymphadenopathy:    She has no cervical adenopathy.  Neurological: She is alert and oriented to person, place, and time. No cranial nerve deficit.  Skin: Skin is warm and dry. No rash noted. She is not diaphoretic. No erythema. No pallor.  Psychiatric: She has a normal mood and affect. Her behavior is normal. Judgment and thought content normal.    Assessment/Plan: Recurrent SBO with history of Congenital Malrotation.  SBO, with exploratory laparotomy with lysis of adhesions 5/2/2009Dr. Baruch Merl.   Hx of hysterectomy and appendectomy Hypertension Macular edema secondary to retinal branch occlusion left eye Osteoporosis  Plan:  Admit, I will hold off on NG for now, she has not vomited since 7/15, IV  fluids, bowel rest.  She go contrast so we can watch that tonight and repeat films in AM.    Neveah Bang 07/10/2017, 12:55 PM

## 2017-07-10 NOTE — ED Triage Notes (Addendum)
Pt reports one week hx of abdominal pain. Pt c/o nausea, constipation, fatigue . Vomiting x 4. Seen by Dr Amanda CockaynePrevo today. Herby Abraham. Xray completed. Possible SBO Pt has been treated with Zofran and Prilosec and GasX

## 2017-07-10 NOTE — ED Notes (Signed)
Attempted to call report again.  Nurse didn't pick up.

## 2017-07-10 NOTE — ED Notes (Signed)
Attempted to call report. Asked to call back.

## 2017-07-10 NOTE — ED Notes (Signed)
Per NP/PCP-states patient has not had a BM in 9 days-abdominal distention, increased discomfort-states patient has a history of the same-has had surgery in the past to resolve symptoms

## 2017-07-11 ENCOUNTER — Inpatient Hospital Stay (HOSPITAL_COMMUNITY): Payer: Medicare HMO

## 2017-07-11 DIAGNOSIS — M810 Age-related osteoporosis without current pathological fracture: Secondary | ICD-10-CM | POA: Diagnosis not present

## 2017-07-11 DIAGNOSIS — Z8249 Family history of ischemic heart disease and other diseases of the circulatory system: Secondary | ICD-10-CM | POA: Diagnosis not present

## 2017-07-11 DIAGNOSIS — E876 Hypokalemia: Secondary | ICD-10-CM | POA: Diagnosis not present

## 2017-07-11 DIAGNOSIS — K565 Intestinal adhesions [bands], unspecified as to partial versus complete obstruction: Secondary | ICD-10-CM | POA: Diagnosis not present

## 2017-07-11 DIAGNOSIS — Z90710 Acquired absence of both cervix and uterus: Secondary | ICD-10-CM | POA: Diagnosis not present

## 2017-07-11 DIAGNOSIS — I10 Essential (primary) hypertension: Secondary | ICD-10-CM | POA: Diagnosis not present

## 2017-07-11 DIAGNOSIS — H3581 Retinal edema: Secondary | ICD-10-CM | POA: Diagnosis not present

## 2017-07-11 DIAGNOSIS — K8020 Calculus of gallbladder without cholecystitis without obstruction: Secondary | ICD-10-CM | POA: Diagnosis not present

## 2017-07-11 DIAGNOSIS — Z4682 Encounter for fitting and adjustment of non-vascular catheter: Secondary | ICD-10-CM | POA: Diagnosis not present

## 2017-07-11 DIAGNOSIS — Z79899 Other long term (current) drug therapy: Secondary | ICD-10-CM | POA: Diagnosis not present

## 2017-07-11 DIAGNOSIS — K5669 Other partial intestinal obstruction: Secondary | ICD-10-CM | POA: Diagnosis not present

## 2017-07-11 DIAGNOSIS — K5651 Intestinal adhesions [bands], with partial obstruction: Secondary | ICD-10-CM | POA: Diagnosis not present

## 2017-07-11 DIAGNOSIS — F5089 Other specified eating disorder: Secondary | ICD-10-CM | POA: Diagnosis present

## 2017-07-11 DIAGNOSIS — K56609 Unspecified intestinal obstruction, unspecified as to partial versus complete obstruction: Secondary | ICD-10-CM | POA: Diagnosis not present

## 2017-07-11 LAB — CBC
HEMATOCRIT: 34.1 % — AB (ref 36.0–46.0)
Hemoglobin: 11.9 g/dL — ABNORMAL LOW (ref 12.0–15.0)
MCH: 32.3 pg (ref 26.0–34.0)
MCHC: 34.9 g/dL (ref 30.0–36.0)
MCV: 92.7 fL (ref 78.0–100.0)
PLATELETS: 230 10*3/uL (ref 150–400)
RBC: 3.68 MIL/uL — ABNORMAL LOW (ref 3.87–5.11)
RDW: 12.5 % (ref 11.5–15.5)
WBC: 9.6 10*3/uL (ref 4.0–10.5)

## 2017-07-11 LAB — BASIC METABOLIC PANEL
Anion gap: 12 (ref 5–15)
BUN: 21 mg/dL — AB (ref 6–20)
CALCIUM: 7.9 mg/dL — AB (ref 8.9–10.3)
CO2: 26 mmol/L (ref 22–32)
CREATININE: 0.62 mg/dL (ref 0.44–1.00)
Chloride: 92 mmol/L — ABNORMAL LOW (ref 101–111)
GFR calc Af Amer: >60 mL/min (ref >60)
Glucose, Bld: 74 mg/dL (ref 65–99)
POTASSIUM: 3.7 mmol/L (ref 3.5–5.1)
SODIUM: 130 mmol/L — AB (ref 135–145)

## 2017-07-11 MED ORDER — METRONIDAZOLE 0.75 % EX GEL
Freq: Two times a day (BID) | CUTANEOUS | Status: DC
  Administered 2017-07-11 – 2017-07-13 (×3): via TOPICAL

## 2017-07-11 MED ORDER — CYCLOSPORINE 0.05 % OP EMUL
1.0000 [drp] | Freq: Two times a day (BID) | OPHTHALMIC | Status: DC
  Administered 2017-07-11 – 2017-07-13 (×4): 1 [drp] via OPHTHALMIC
  Filled 2017-07-11 (×5): qty 1

## 2017-07-11 MED ORDER — DEXTROSE-NACL 5-0.9 % IV SOLN
INTRAVENOUS | Status: DC
  Administered 2017-07-11 – 2017-07-13 (×4): via INTRAVENOUS

## 2017-07-11 MED ORDER — PANTOPRAZOLE SODIUM 40 MG IV SOLR
40.0000 mg | INTRAVENOUS | Status: DC
  Administered 2017-07-11 – 2017-07-12 (×2): 40 mg via INTRAVENOUS
  Filled 2017-07-11 (×2): qty 40

## 2017-07-11 MED ORDER — DIATRIZOATE MEGLUMINE & SODIUM 66-10 % PO SOLN
90.0000 mL | Freq: Once | ORAL | Status: AC
  Administered 2017-07-11: 90 mL via NASOGASTRIC
  Filled 2017-07-11: qty 90

## 2017-07-11 MED ORDER — MORPHINE SULFATE (PF) 2 MG/ML IV SOLN: 1.0000 mg | INTRAVENOUS | Status: DC | PRN

## 2017-07-11 NOTE — Progress Notes (Signed)
Husband (retired MD) states pt is fatigued from ambulating and being NPO. Requesting fluids to be changed from NS to D5NS. Will page provider.  Paged Dr. Daphine DeutscherMartin per above and passed on to night nurse, Clayborne DanaPatti.

## 2017-07-11 NOTE — Progress Notes (Signed)
Patient ID: [name], female   DOB: 05-23-1946, 71 y.o.   MRN: [id number]  [facility] Surgery Progress Note     Subjective: CC- SBO Patient states that NG tube was placed late last night. Continues to have abdominal distension, but feels that it is less than yesterday. Abdomen sore but she reports little pain. Denies n/v. No flatus or BM.  Objective: Vital signs in last 24 hours: Temp:  [98.2 F (36.8 C)-99.3 F (37.4 C)] 98.2 F (36.8 C) (07/20 0604) Pulse Rate:  [87-97] 90 (07/20 0604) Resp:  [15-25] 15 (07/20 0604) BP: (118-158)/(75-92) 145/85 (07/20 0604) SpO2:  [96 %-100 %] 96 % (07/20 0604) Weight:  [111 lb (50.3 kg)] 111 lb (50.3 kg) (07/19 2043) Last BM Date: 07/01/17  Intake/Output from previous day: 07/19 0701 - 07/20 0700 In: 2025 [I.V.:1025; IV Piggyback:1000] Out: 350 [Urine:300; Emesis/NG output:50] Intake/Output this shift: No intake/output data recorded.  PE: Gen:  Alert, NAD, pleasant HEENT: EOM's intact, pupils equal  Card:  RRR, no M/G/R heard Pulm:  CTAB, no W/R/R, effort normal Abd: Soft, distended, hypoactive BS, mild lower abdominal tenderness, no rebound or guarding Ext:  No erythema, edema, or tenderness BUE/BLE  Psych: A&Ox3  Skin: no rashes noted, warm and dry  Lab Results:   Recent Labs  07/10/17 1821 07/11/17 0538  WBC 8.2 9.6  HGB 13.5 11.9*  HCT 38.1 34.1*  PLT 259 230   BMET  Recent Labs  07/10/17 1202 07/10/17 1821 07/11/17 0538  NA 129*  --  130*  K 3.5  --  3.7  CL 86*  --  92*  CO2 29  --  26  GLUCOSE 114*  --  74  BUN 39*  --  21*  CREATININE 1.16* 0.78 0.62  CALCIUM 9.1  --  7.9*   PT/INR  Recent Labs  07/10/17 1821  LABPROT 13.8  INR 1.06   CMP     Component Value Date/Time   NA 130 (L) 07/11/2017 0538   K 3.7 07/11/2017 0538   CL 92 (L) 07/11/2017 0538   CO2 26 07/11/2017 0538   GLUCOSE 74 07/11/2017 0538   BUN 21 (H) 07/11/2017 0538   CREATININE 0.62 07/11/2017 0538   CALCIUM 7.9  (L) 07/11/2017 0538   PROT 8.5 (H) 07/10/2017 1202   ALBUMIN 4.9 07/10/2017 1202   AST 39 07/10/2017 1202   ALT 24 07/10/2017 1202   ALKPHOS 56 07/10/2017 1202   BILITOT 3.0 (H) 07/10/2017 1202   GFRNONAA >60 07/11/2017 0538   GFRAA >60 07/11/2017 0538   Lipase     Component Value Date/Time   LIPASE 38 07/10/2017 1202       Studies/Results: Ct Abdomen Pelvis W Contrast  Result Date: 07/10/2017 CLINICAL DATA:  Abdominal discomfort with findings of small-bowel obstruction on recent plain film examination. EXAM: CT ABDOMEN AND PELVIS WITH CONTRAST TECHNIQUE: Multidetector CT imaging of the abdomen and pelvis was performed using the standard protocol following bolus administration of intravenous contrast. CONTRAST:  ISOVUE-300 IOPAMIDOL (ISOVUE-300) INJECTION 61%, 30mL ISOVUE-300 IOPAMIDOL (ISOVUE-300) INJECTION 61% COMPARISON:  Plain film from earlier in the same day. FINDINGS: Lower chest: No acute abnormality. Hepatobiliary: Mild decreased attenuation is noted within the liver consistent with fatty infiltration. Some small dependent gallstones are noted. A small hepatic cyst is noted in the tip of the right lobe. Pancreas: Unremarkable. No pancreatic ductal dilatation or surrounding inflammatory changes. Spleen: Normal in size without focal abnormality. Adrenals/Urinary Tract: The adrenal glands are within normal  limits. Kidneys demonstrate a normal enhancement pattern bilaterally. No renal calculi or obstructive changes are seen. The bladder is decompressed. Stomach/Bowel: There is significant small bowel dilatation identified. A transition zone is noted in the mid abdomen best seen on image she is 46 and 47 of series to as well as image number 76 of series 4. Given the postsurgical changes in the abdomen this likely represents adhesions. No definitive mass lesion is identified. The colon is decompressed. No free fluid or free air is identified. Vascular/Lymphatic: Aortic atherosclerosis.  No enlarged abdominal or pelvic lymph nodes. Reproductive: Status post hysterectomy. No adnexal masses. Other: No abdominal wall hernia or abnormality. No abdominopelvic ascites. Musculoskeletal: Degenerative changes of lumbar spine are noted. IMPRESSION: High-grade small bowel obstruction with a transition point in the mid abdomen as described likely related to postoperative Adhesions. Small gallstones without complicating factors. Chronic changes as described above without other acute abnormality. Electronically Signed   By: Alcide Clever M.D.   On: 07/10/2017 15:51   Dg Abd Portable 1v  Result Date: 07/10/2017 CLINICAL DATA:  NG tube placement EXAM: PORTABLE ABDOMEN - 1 VIEW COMPARISON:  Abdominal radiograph 07/10/2017 at 5:41 p.m. FINDINGS: The tip and side port of the nasogastric tube now overlies the gastric body. Dilated gas-filled bowel throughout the abdomen is unchanged. IMPRESSION: NG tube tip and side port in the stomach. Electronically Signed   By: Deatra Robinson M.D.   On: 07/10/2017 22:32   Dg Abd Portable 1 View  Result Date: 07/10/2017 CLINICAL DATA:  Bedside nasogastric tube placement and repositioning. EXAM: PORTABLE ABDOMEN - 1 VIEW 5:41 p.m.: COMPARISON:  Portable abdomen x-ray earlier today 5:06 p.m. CT abdomen pelvis earlier today. FINDINGS: The loop in the nasogastric tube identified earlier has resolved. The tip of the tube is now just into the proximal stomach with the side hole in the distal esophagus. The tube should be advanced several cm. Marked gaseous distention of the small bowel as noted on the earlier examinations. No evidence of free intraperitoneal air on the semi-erect image. IMPRESSION: 1. Nasogastric tube tip now in the proximal stomach with the side hole in the distal esophagus. The tube should be advanced several cm. 2. Small bowel obstruction as identified on the CT earlier today. No free intraperitoneal air. Electronically Signed   By: Hulan Saas M.D.   On:  07/10/2017 17:55   Dg Abd Portable 1v  Result Date: 07/10/2017 CLINICAL DATA:  NG tube placement. EXAM: PORTABLE ABDOMEN - 1 VIEW COMPARISON:  None. FINDINGS: Nasogastric tube curls in the distal esophagus. The tip is directed cephalad projecting in the mid esophagus near the level of the carina. There is persistent bowel dilation consistent with obstruction. IMPRESSION: 1. Malpositioned nasogastric tube. Tube curls in the distal esophagus and does not enter the stomach. Electronically Signed   By: Amie Portland M.D.   On: 07/10/2017 17:24    Anti-infectives: Anti-infectives    None       Assessment/Plan Hypertension Macular edema secondary to retinal branch occlusion left eye Osteoporosis  SBO, recurrent - h/o Congenital Malrotation.  SBO, with exploratory laparotomy with lysis of adhesions 5/2/2009Dr. Baruch Merl; Hx of hysterectomy and appendectomy - CT 7/19 showed high-grade small bowel obstruction with a transition point in the mid abdomen, likely related to postoperative adhesions - NG tube placed yesterday  - will start patient on small bowel protocol today.  ID - none FEN - IVF, NPO/NGT VTE - SCDs, heparin   LOS: 0 days  Edson SnowballBROOKE A MILLER , [facility] 07/11/2017, 8:09 AM Pager: [phone] Consults: [phone] Mon-Fri 7:00 am-4:30 pm Sat-Sun 7:00 am-11:30 am

## 2017-07-11 NOTE — Progress Notes (Signed)
Pt and husband requesting to use patient supplied medication from home: 1 drop to both eyes of Restasis 0.05% twice daily, and Metrogel 0.75% topically to face twice daily. Paged Evangeline GulaBrooke Miller, PA-C for approval of above.   Received call back from SalineBrooke. Discussed above with her. She gave ok for above meds from home to be used and kept in pt room and placed orders. Relayed to pt and husband and they are agreeable to plan.

## 2017-07-12 LAB — BASIC METABOLIC PANEL
Anion gap: 11 (ref 5–15)
BUN: 19 mg/dL (ref 6–20)
CALCIUM: 7.8 mg/dL — AB (ref 8.9–10.3)
CO2: 27 mmol/L (ref 22–32)
Chloride: 98 mmol/L — ABNORMAL LOW (ref 101–111)
Creatinine, Ser: 0.56 mg/dL (ref 0.44–1.00)
GFR calc Af Amer: >60 mL/min (ref >60)
GLUCOSE: 156 mg/dL — AB (ref 65–99)
Potassium: 3.6 mmol/L (ref 3.5–5.1)
Sodium: 136 mmol/L (ref 135–145)

## 2017-07-12 LAB — CBC
HCT: 35.3 % — ABNORMAL LOW (ref 36.0–46.0)
Hemoglobin: 12.2 g/dL (ref 12.0–15.0)
MCH: 32.4 pg (ref 26.0–34.0)
MCHC: 34.6 g/dL (ref 30.0–36.0)
MCV: 93.6 fL (ref 78.0–100.0)
PLATELETS: 259 10*3/uL (ref 150–400)
RBC: 3.77 MIL/uL — ABNORMAL LOW (ref 3.87–5.11)
RDW: 12.8 % (ref 11.5–15.5)
WBC: 9 10*3/uL (ref 4.0–10.5)

## 2017-07-12 LAB — URINE CULTURE

## 2017-07-12 NOTE — Progress Notes (Signed)
Patient ID: [name], female   DOB: 01/22/1946, 71 y.o.   MRN: [id number] [facility] Surgery Progress Note:   * No surgery found *  Subjective: Mental status is clear.  No new complaints of pain.  From Dr. Tresa Moore note May 2009:  "There were number of other adhesions between small bowel loops.  The  entire small bowel was run.  The duodenum, of note, was in the midline.  All of the small bowel was in the right abdomen and the majority of the  colon was in the left upper quadrant and left abdomen.  No other twist  in the mesentery or mesenteric defects were seen. "    Objective: Vital signs in last 24 hours: Temp:  [98.7 F (37.1 C)-99.4 F (37.4 C)] 98.7 F (37.1 C) (07/21 0616) Pulse Rate:  [89-94] 89 (07/21 0616) Resp:  [16] 16 (07/21 0616) BP: (142-143)/(74-79) 142/79 (07/21 0616) SpO2:  [98 %] 98 % (07/21 0616)  Intake/Output from previous day: 07/20 0701 - 07/21 0700 In: 2100 [I.V.:2100] Out: 1950 [Urine:1100; Emesis/NG output:850] Intake/Output this shift: No intake/output data recorded.  Physical Exam: Work of breathing is normal;  Abdomen is nontender but mildly distended.  No flatus  Lab Results:  Results for orders placed or performed during the hospital encounter of 07/10/17 (from the past 48 hour(s))  Lipase, blood     Status: None   Collection Time: 07/10/17 12:02 PM  Result Value Ref Range   Lipase 38 11 - 51 U/L  Comprehensive metabolic panel     Status: Abnormal   Collection Time: 07/10/17 12:02 PM  Result Value Ref Range   Sodium 129 (L) 135 - 145 mmol/L   Potassium 3.5 3.5 - 5.1 mmol/L   Chloride 86 (L) 101 - 111 mmol/L   CO2 29 22 - 32 mmol/L   Glucose, Bld 114 (H) 65 - 99 mg/dL   BUN 39 (H) 6 - 20 mg/dL   Creatinine, Ser 1.16 (H) 0.44 - 1.00 mg/dL   Calcium 9.1 8.9 - 10.3 mg/dL   Total Protein 8.5 (H) 6.5 - 8.1 g/dL   Albumin 4.9 3.5 - 5.0 g/dL   AST 39 15 - 41 U/L   ALT 24 14 - 54 U/L   Alkaline Phosphatase 56 38 - 126 U/L   Total  Bilirubin 3.0 (H) 0.3 - 1.2 mg/dL   GFR calc non Af Amer 46 (L) >60 mL/min   GFR calc Af Amer 53 (L) >60 mL/min    Comment: (NOTE) The eGFR has been calculated using the CKD EPI equation. This calculation has not been validated in all clinical situations. eGFR's persistently <60 mL/min signify possible Chronic Kidney Disease.    Anion gap 14 5 - 15  CBC     Status: None   Collection Time: 07/10/17 12:02 PM  Result Value Ref Range   WBC 8.9 4.0 - 10.5 K/uL   RBC 4.47 3.87 - 5.11 MIL/uL   Hemoglobin 14.8 12.0 - 15.0 g/dL   HCT 41.5 36.0 - 46.0 %   MCV 92.8 78.0 - 100.0 fL   MCH 33.1 26.0 - 34.0 pg   MCHC 35.7 30.0 - 36.0 g/dL   RDW 12.5 11.5 - 15.5 %   Platelets 299 150 - 400 K/uL  Troponin I     Status: None   Collection Time: 07/10/17 12:02 PM  Result Value Ref Range   Troponin I <0.03 <0.03 ng/mL  Urinalysis, Routine w reflex microscopic  Status: Abnormal   Collection Time: 07/10/17 12:21 PM  Result Value Ref Range   Color, Urine AMBER (A) YELLOW    Comment: BIOCHEMICALS MAY BE AFFECTED BY COLOR   APPearance CLOUDY (A) CLEAR   Specific Gravity, Urine 1.021 1.005 - 1.030   pH 5.0 5.0 - 8.0   Glucose, UA NEGATIVE NEGATIVE mg/dL   Hgb urine dipstick SMALL (A) NEGATIVE   Bilirubin Urine SMALL (A) NEGATIVE   Ketones, ur 20 (A) NEGATIVE mg/dL   Protein, ur 30 (A) NEGATIVE mg/dL   Nitrite NEGATIVE NEGATIVE   Leukocytes, UA SMALL (A) NEGATIVE   RBC / HPF 0-5 0 - 5 RBC/hpf   WBC, UA 6-30 0 - 5 WBC/hpf   Bacteria, UA MANY (A) NONE SEEN   Squamous Epithelial / LPF 0-5 (A) NONE SEEN   Mucous PRESENT    Hyaline Casts, UA PRESENT   Urine culture     Status: Abnormal   Collection Time: 07/10/17 12:21 PM  Result Value Ref Range   Specimen Description URINE, RANDOM    Special Requests NONE    Culture MULTIPLE SPECIES PRESENT, SUGGEST RECOLLECTION (A)    Report Status 07/12/2017 FINAL   CBC     Status: None   Collection Time: 07/10/17  6:21 PM  Result Value Ref Range   WBC  8.2 4.0 - 10.5 K/uL   RBC 4.12 3.87 - 5.11 MIL/uL   Hemoglobin 13.5 12.0 - 15.0 g/dL   HCT 38.1 36.0 - 46.0 %   MCV 92.5 78.0 - 100.0 fL   MCH 32.8 26.0 - 34.0 pg   MCHC 35.4 30.0 - 36.0 g/dL   RDW 12.4 11.5 - 15.5 %   Platelets 259 150 - 400 K/uL  Creatinine, serum     Status: None   Collection Time: 07/10/17  6:21 PM  Result Value Ref Range   Creatinine, Ser 0.78 0.44 - 1.00 mg/dL   GFR calc non Af Amer >60 >60 mL/min   GFR calc Af Amer >60 >60 mL/min    Comment: (NOTE) The eGFR has been calculated using the CKD EPI equation. This calculation has not been validated in all clinical situations. eGFR's persistently <60 mL/min signify possible Chronic Kidney Disease.   APTT     Status: None   Collection Time: 07/10/17  6:21 PM  Result Value Ref Range   aPTT 27 24 - 36 seconds  Protime-INR     Status: None   Collection Time: 07/10/17  6:21 PM  Result Value Ref Range   Prothrombin Time 13.8 11.4 - 15.2 seconds   INR 1.44   Basic metabolic panel     Status: Abnormal   Collection Time: 07/11/17  5:38 AM  Result Value Ref Range   Sodium 130 (L) 135 - 145 mmol/L   Potassium 3.7 3.5 - 5.1 mmol/L   Chloride 92 (L) 101 - 111 mmol/L   CO2 26 22 - 32 mmol/L   Glucose, Bld 74 65 - 99 mg/dL   BUN 21 (H) 6 - 20 mg/dL   Creatinine, Ser 0.62 0.44 - 1.00 mg/dL   Calcium 7.9 (L) 8.9 - 10.3 mg/dL   GFR calc non Af Amer >60 >60 mL/min   GFR calc Af Amer >60 >60 mL/min    Comment: (NOTE) The eGFR has been calculated using the CKD EPI equation. This calculation has not been validated in all clinical situations. eGFR's persistently <60 mL/min signify possible Chronic Kidney Disease.    Anion gap 12 5 -  15  CBC     Status: Abnormal   Collection Time: 07/11/17  5:38 AM  Result Value Ref Range   WBC 9.6 4.0 - 10.5 K/uL   RBC 3.68 (L) 3.87 - 5.11 MIL/uL   Hemoglobin 11.9 (L) 12.0 - 15.0 g/dL   HCT 34.1 (L) 36.0 - 46.0 %   MCV 92.7 78.0 - 100.0 fL   MCH 32.3 26.0 - 34.0 pg   MCHC 34.9  30.0 - 36.0 g/dL   RDW 12.5 11.5 - 15.5 %   Platelets 230 150 - 400 K/uL  CBC     Status: Abnormal   Collection Time: 07/12/17  5:14 AM  Result Value Ref Range   WBC 9.0 4.0 - 10.5 K/uL   RBC 3.77 (L) 3.87 - 5.11 MIL/uL   Hemoglobin 12.2 12.0 - 15.0 g/dL   HCT 35.3 (L) 36.0 - 46.0 %   MCV 93.6 78.0 - 100.0 fL   MCH 32.4 26.0 - 34.0 pg   MCHC 34.6 30.0 - 36.0 g/dL   RDW 12.8 11.5 - 15.5 %   Platelets 259 150 - 400 K/uL  Basic metabolic panel     Status: Abnormal   Collection Time: 07/12/17  5:14 AM  Result Value Ref Range   Sodium 136 135 - 145 mmol/L   Potassium 3.6 3.5 - 5.1 mmol/L   Chloride 98 (L) 101 - 111 mmol/L   CO2 27 22 - 32 mmol/L   Glucose, Bld 156 (H) 65 - 99 mg/dL   BUN 19 6 - 20 mg/dL   Creatinine, Ser 0.56 0.44 - 1.00 mg/dL   Calcium 7.8 (L) 8.9 - 10.3 mg/dL   GFR calc non Af Amer >60 >60 mL/min   GFR calc Af Amer >60 >60 mL/min    Comment: (NOTE) The eGFR has been calculated using the CKD EPI equation. This calculation has not been validated in all clinical situations. eGFR's persistently <60 mL/min signify possible Chronic Kidney Disease.    Anion gap 11 5 - 15    Radiology/Results: Ct Abdomen Pelvis W Contrast  Result Date: 07/10/2017 CLINICAL DATA:  Abdominal discomfort with findings of small-bowel obstruction on recent plain film examination. EXAM: CT ABDOMEN AND PELVIS WITH CONTRAST TECHNIQUE: Multidetector CT imaging of the abdomen and pelvis was performed using the standard protocol following bolus administration of intravenous contrast. CONTRAST:  185m ISOVUE-300 IOPAMIDOL (ISOVUE-300) INJECTION 61%, 367mISOVUE-300 IOPAMIDOL (ISOVUE-300) INJECTION 61% COMPARISON:  Plain film from earlier in the same day. FINDINGS: Lower chest: No acute abnormality. Hepatobiliary: Mild decreased attenuation is noted within the liver consistent with fatty infiltration. Some small dependent gallstones are noted. A small hepatic cyst is noted in the tip of the right lobe.  Pancreas: Unremarkable. No pancreatic ductal dilatation or surrounding inflammatory changes. Spleen: Normal in size without focal abnormality. Adrenals/Urinary Tract: The adrenal glands are within normal limits. Kidneys demonstrate a normal enhancement pattern bilaterally. No renal calculi or obstructive changes are seen. The bladder is decompressed. Stomach/Bowel: There is significant small bowel dilatation identified. A transition zone is noted in the mid abdomen best seen on image she is 46 and 47 of series to as well as image number 76 of series 4. Given the postsurgical changes in the abdomen this likely represents adhesions. No definitive mass lesion is identified. The colon is decompressed. No free fluid or free air is identified. Vascular/Lymphatic: Aortic atherosclerosis. No enlarged abdominal or pelvic lymph nodes. Reproductive: Status post hysterectomy. No adnexal masses. Other: No abdominal wall hernia  or abnormality. No abdominopelvic ascites. Musculoskeletal: Degenerative changes of lumbar spine are noted. IMPRESSION: High-grade small bowel obstruction with a transition point in the mid abdomen as described likely related to postoperative Adhesions. Small gallstones without complicating factors. Chronic changes as described above without other acute abnormality. Electronically Signed   By: Inez Catalina M.D.   On: 07/10/2017 15:51   Dg Abd Portable 1v-small Bowel Obstruction Protocol-initial, 8 Hr Delay  Result Date: 07/11/2017 CLINICAL DATA:  SBO protocol EXAM: PORTABLE ABDOMEN - 1 VIEW COMPARISON:  Abdominal radiographs dated 07/10/2017. CT abdomen/pelvis dated 07/10/2017. FINDINGS: Enteric tube terminates in the proximal gastric body. Dilated loops of small bowel in the central abdomen, compatible with small bowel obstruction. This is similar to the prior yesterday. Cholecystectomy clips. IMPRESSION: Stable small bowel obstruction. Enteric tube terminates in the proximal gastric body.  Electronically Signed   By: Julian Hy M.D.   On: 07/11/2017 18:41   Dg Abd Portable 1v  Result Date: 07/10/2017 CLINICAL DATA:  NG tube placement EXAM: PORTABLE ABDOMEN - 1 VIEW COMPARISON:  Abdominal radiograph 07/10/2017 at 5:41 p.m. FINDINGS: The tip and side port of the nasogastric tube now overlies the gastric body. Dilated gas-filled bowel throughout the abdomen is unchanged. IMPRESSION: NG tube tip and side port in the stomach. Electronically Signed   By: Ulyses Jarred M.D.   On: 07/10/2017 22:32   Dg Abd Portable 1 View  Result Date: 07/10/2017 CLINICAL DATA:  Bedside nasogastric tube placement and repositioning. EXAM: PORTABLE ABDOMEN - 1 VIEW 5:41 p.m.: COMPARISON:  Portable abdomen x-ray earlier today 5:06 p.m. CT abdomen pelvis earlier today. FINDINGS: The loop in the nasogastric tube identified earlier has resolved. The tip of the tube is now just into the proximal stomach with the side hole in the distal esophagus. The tube should be advanced several cm. Marked gaseous distention of the small bowel as noted on the earlier examinations. No evidence of free intraperitoneal air on the semi-erect image. IMPRESSION: 1. Nasogastric tube tip now in the proximal stomach with the side hole in the distal esophagus. The tube should be advanced several cm. 2. Small bowel obstruction as identified on the CT earlier today. No free intraperitoneal air. Electronically Signed   By: Evangeline Dakin M.D.   On: 07/10/2017 17:55   Dg Abd Portable 1v  Result Date: 07/10/2017 CLINICAL DATA:  NG tube placement. EXAM: PORTABLE ABDOMEN - 1 VIEW COMPARISON:  None. FINDINGS: Nasogastric tube curls in the distal esophagus. The tip is directed cephalad projecting in the mid esophagus near the level of the carina. There is persistent bowel dilation consistent with obstruction. IMPRESSION: 1. Malpositioned nasogastric tube. Tube curls in the distal esophagus and does not enter the stomach. Electronically Signed    By: Lajean Manes M.D.   On: 07/10/2017 17:24    Anti-infectives: Anti-infectives    None      Assessment/Plan: Problem List: Patient Active Problem List   Diagnosis Date Noted  . SBO (small bowel obstruction) ([place]) 07/10/2017    Small bowel obstruction in patient with malrotation and prior surgery for adhesion related obstruction.   * No surgery found *    LOS: 1 day   Matt B. Hassell Done, MD, [facility] [phone] beeper [phone]  07/12/2017 9:45 AM

## 2017-07-12 NOTE — Plan of Care (Signed)
Problem: Nutrition: Goal: Adequate nutrition will be maintained Outcome: Not Progressing Pt still NPO

## 2017-07-12 NOTE — Progress Notes (Signed)
Spoke with Dr Daphine DeutscherMartin about family's request to change fluids. Received orders for D5NS.

## 2017-07-13 ENCOUNTER — Inpatient Hospital Stay (HOSPITAL_COMMUNITY): Payer: Medicare HMO | Admitting: Certified Registered Nurse Anesthetist

## 2017-07-13 ENCOUNTER — Encounter (HOSPITAL_COMMUNITY): Payer: Self-pay | Admitting: Anesthesiology

## 2017-07-13 ENCOUNTER — Encounter (HOSPITAL_COMMUNITY): Admission: EM | Disposition: A | Discharge status: Home / Self Care | Payer: Self-pay | Source: Home / Self Care

## 2017-07-13 DIAGNOSIS — Z9889 Other specified postprocedural states: Secondary | ICD-10-CM

## 2017-07-13 HISTORY — PX: LAPAROTOMY: SHX154

## 2017-07-13 LAB — SURGICAL PCR SCREEN
MRSA, PCR: NEGATIVE
STAPHYLOCOCCUS AUREUS: NEGATIVE

## 2017-07-13 SURGERY — LAPAROTOMY, EXPLORATORY
Anesthesia: General | Site: Abdomen

## 2017-07-13 MED ORDER — 0.9 % SODIUM CHLORIDE (POUR BTL) OPTIME
TOPICAL | Status: DC | PRN
  Administered 2017-07-13 (×2): 1000 mL

## 2017-07-13 MED ORDER — KCL IN DEXTROSE-NACL 20-5-0.45 MEQ/L-%-% IV SOLN
INTRAVENOUS | Status: DC
  Administered 2017-07-13: 16:00:00 via INTRAVENOUS
  Administered 2017-07-14: 1000 mL via INTRAVENOUS
  Administered 2017-07-14 (×2): via INTRAVENOUS
  Administered 2017-07-15: 100 mL/h via INTRAVENOUS
  Administered 2017-07-15: 1000 mL via INTRAVENOUS
  Administered 2017-07-16: 11:00:00 via INTRAVENOUS
  Administered 2017-07-16: 1000 mL via INTRAVENOUS
  Administered 2017-07-16: 100 mL/h via INTRAVENOUS
  Administered 2017-07-17 – 2017-07-19 (×4): via INTRAVENOUS
  Filled 2017-07-13 (×17): qty 1000

## 2017-07-13 MED ORDER — HYDROMORPHONE HCL-NACL 0.5-0.9 MG/ML-% IV SOSY
0.2500 mg | PREFILLED_SYRINGE | INTRAVENOUS | Status: DC | PRN
  Administered 2017-07-13 (×2): 0.25 mg via INTRAVENOUS

## 2017-07-13 MED ORDER — PROPOFOL 10 MG/ML IV BOLUS
INTRAVENOUS | Status: DC | PRN
  Administered 2017-07-13: 90 mg via INTRAVENOUS

## 2017-07-13 MED ORDER — CYCLOSPORINE 0.05 % OP EMUL
1.0000 [drp] | Freq: Two times a day (BID) | OPHTHALMIC | Status: DC
  Administered 2017-07-13 – 2017-07-18 (×9): 1 [drp] via OPHTHALMIC
  Filled 2017-07-13 (×14): qty 1

## 2017-07-13 MED ORDER — SUCCINYLCHOLINE CHLORIDE 200 MG/10ML IV SOSY
PREFILLED_SYRINGE | INTRAVENOUS | Status: AC
  Filled 2017-07-13: qty 10

## 2017-07-13 MED ORDER — LACTATED RINGERS IV SOLN: INTRAVENOUS | Status: DC

## 2017-07-13 MED ORDER — CEFAZOLIN SODIUM-DEXTROSE 2-3 GM-% IV SOLR
INTRAVENOUS | Status: DC | PRN
  Administered 2017-07-13: 2 g via INTRAVENOUS

## 2017-07-13 MED ORDER — ONDANSETRON 4 MG PO TBDP: 4.0000 mg | ORAL_TABLET | Freq: Four times a day (QID) | ORAL | Status: DC | PRN

## 2017-07-13 MED ORDER — LIDOCAINE 2% (20 MG/ML) 5 ML SYRINGE
INTRAMUSCULAR | Status: AC
  Filled 2017-07-13: qty 5

## 2017-07-13 MED ORDER — CEFAZOLIN SODIUM-DEXTROSE 2-4 GM/100ML-% IV SOLN
INTRAVENOUS | Status: AC
  Filled 2017-07-13: qty 100

## 2017-07-13 MED ORDER — KETOROLAC TROMETHAMINE 15 MG/ML IJ SOLN
15.0000 mg | Freq: Once | INTRAMUSCULAR | Status: AC
  Administered 2017-07-13: 15 mg via INTRAVENOUS

## 2017-07-13 MED ORDER — HYDRALAZINE HCL 20 MG/ML IJ SOLN
10.0000 mg | INTRAMUSCULAR | Status: DC | PRN
  Filled 2017-07-13: qty 0.5

## 2017-07-13 MED ORDER — ROCURONIUM BROMIDE 50 MG/5ML IV SOSY
PREFILLED_SYRINGE | INTRAVENOUS | Status: AC
  Filled 2017-07-13: qty 5

## 2017-07-13 MED ORDER — DEXAMETHASONE SODIUM PHOSPHATE 10 MG/ML IJ SOLN
INTRAMUSCULAR | Status: DC | PRN
  Administered 2017-07-13: 5 mg via INTRAVENOUS

## 2017-07-13 MED ORDER — ONDANSETRON HCL 4 MG/2ML IJ SOLN
INTRAMUSCULAR | Status: DC | PRN
  Administered 2017-07-13: 4 mg via INTRAVENOUS

## 2017-07-13 MED ORDER — HYDROMORPHONE HCL-NACL 0.5-0.9 MG/ML-% IV SOSY
PREFILLED_SYRINGE | INTRAVENOUS | Status: AC
  Filled 2017-07-13: qty 1

## 2017-07-13 MED ORDER — PROMETHAZINE HCL 25 MG/ML IJ SOLN: 6.2500 mg | INTRAMUSCULAR | Status: DC | PRN

## 2017-07-13 MED ORDER — CEFAZOLIN SODIUM-DEXTROSE 2-4 GM/100ML-% IV SOLN
2.0000 g | Freq: Three times a day (TID) | INTRAVENOUS | Status: AC
  Administered 2017-07-13: 2 g via INTRAVENOUS
  Filled 2017-07-13 (×2): qty 100

## 2017-07-13 MED ORDER — SUGAMMADEX SODIUM 200 MG/2ML IV SOLN
INTRAVENOUS | Status: AC
  Filled 2017-07-13: qty 2

## 2017-07-13 MED ORDER — MEPERIDINE HCL 50 MG/ML IJ SOLN: 6.2500 mg | INTRAMUSCULAR | Status: DC | PRN

## 2017-07-13 MED ORDER — FENTANYL CITRATE (PF) 250 MCG/5ML IJ SOLN
INTRAMUSCULAR | Status: AC
  Filled 2017-07-13: qty 5

## 2017-07-13 MED ORDER — SUCCINYLCHOLINE CHLORIDE 200 MG/10ML IV SOSY
PREFILLED_SYRINGE | INTRAVENOUS | Status: DC | PRN
  Administered 2017-07-13: 80 mg via INTRAVENOUS

## 2017-07-13 MED ORDER — HEPARIN SODIUM (PORCINE) 5000 UNIT/ML IJ SOLN
5000.0000 [IU] | Freq: Three times a day (TID) | INTRAMUSCULAR | Status: DC
  Administered 2017-07-13 – 2017-07-17 (×13): 5000 [IU] via SUBCUTANEOUS
  Filled 2017-07-13 (×13): qty 1

## 2017-07-13 MED ORDER — DIPHENHYDRAMINE HCL 12.5 MG/5ML PO ELIX: 12.5000 mg | ORAL_SOLUTION | Freq: Four times a day (QID) | ORAL | Status: DC | PRN

## 2017-07-13 MED ORDER — PHENYLEPHRINE 40 MCG/ML (10ML) SYRINGE FOR IV PUSH (FOR BLOOD PRESSURE SUPPORT)
PREFILLED_SYRINGE | INTRAVENOUS | Status: DC | PRN
  Administered 2017-07-13 (×5): 80 ug via INTRAVENOUS

## 2017-07-13 MED ORDER — KETOROLAC TROMETHAMINE 15 MG/ML IJ SOLN
INTRAMUSCULAR | Status: AC
  Filled 2017-07-13: qty 1

## 2017-07-13 MED ORDER — ONDANSETRON HCL 4 MG/2ML IJ SOLN
4.0000 mg | Freq: Four times a day (QID) | INTRAMUSCULAR | Status: DC | PRN
  Administered 2017-07-13 – 2017-07-14 (×3): 4 mg via INTRAVENOUS
  Filled 2017-07-13 (×3): qty 2

## 2017-07-13 MED ORDER — PROPOFOL 10 MG/ML IV BOLUS
INTRAVENOUS | Status: AC
  Filled 2017-07-13: qty 20

## 2017-07-13 MED ORDER — SUGAMMADEX SODIUM 200 MG/2ML IV SOLN
INTRAVENOUS | Status: DC | PRN
  Administered 2017-07-13: 200 mg via INTRAVENOUS

## 2017-07-13 MED ORDER — LIDOCAINE 2% (20 MG/ML) 5 ML SYRINGE
INTRAMUSCULAR | Status: DC | PRN
Start: 2017-07-13 — End: 2017-07-13
  Administered 2017-07-13: 50 mg via INTRAVENOUS

## 2017-07-13 MED ORDER — ROCURONIUM BROMIDE 50 MG/5ML IV SOSY
PREFILLED_SYRINGE | INTRAVENOUS | Status: DC | PRN
  Administered 2017-07-13: 30 mg via INTRAVENOUS
  Administered 2017-07-13: 10 mg via INTRAVENOUS

## 2017-07-13 MED ORDER — PANTOPRAZOLE SODIUM 40 MG IV SOLR
40.0000 mg | Freq: Every day | INTRAVENOUS | Status: DC
Start: 2017-07-13 — End: 2017-07-20
  Administered 2017-07-13 – 2017-07-19 (×7): 40 mg via INTRAVENOUS
  Filled 2017-07-13 (×6): qty 40

## 2017-07-13 MED ORDER — EPHEDRINE SULFATE-NACL 50-0.9 MG/10ML-% IV SOSY
PREFILLED_SYRINGE | INTRAVENOUS | Status: DC | PRN
  Administered 2017-07-13: 10 mg via INTRAVENOUS

## 2017-07-13 MED ORDER — MORPHINE SULFATE (PF) 2 MG/ML IV SOLN
1.0000 mg | INTRAVENOUS | Status: DC | PRN
  Administered 2017-07-13 – 2017-07-14 (×9): 1 mg via INTRAVENOUS
  Filled 2017-07-13 (×9): qty 1

## 2017-07-13 MED ORDER — ONDANSETRON HCL 4 MG/2ML IJ SOLN
INTRAMUSCULAR | Status: AC
  Filled 2017-07-13: qty 2

## 2017-07-13 MED ORDER — FENTANYL CITRATE (PF) 100 MCG/2ML IJ SOLN
INTRAMUSCULAR | Status: DC | PRN
  Administered 2017-07-13 (×4): 50 ug via INTRAVENOUS

## 2017-07-13 MED ORDER — LACTATED RINGERS IV SOLN
INTRAVENOUS | Status: DC | PRN
  Administered 2017-07-13 (×2): via INTRAVENOUS

## 2017-07-13 MED ORDER — DIPHENHYDRAMINE HCL 50 MG/ML IJ SOLN: 12.5000 mg | Freq: Four times a day (QID) | INTRAMUSCULAR | Status: DC | PRN

## 2017-07-13 MED ORDER — PHENYLEPHRINE 40 MCG/ML (10ML) SYRINGE FOR IV PUSH (FOR BLOOD PRESSURE SUPPORT)
PREFILLED_SYRINGE | INTRAVENOUS | Status: AC
  Filled 2017-07-13: qty 10

## 2017-07-13 SURGICAL SUPPLY — 37 items
APPLICATOR COTTON TIP 6IN STRL (MISCELLANEOUS) ×2 IMPLANT
BLADE EXTENDED COATED 6.5IN (ELECTRODE) ×1 IMPLANT
BLADE HEX COATED 2.75 (ELECTRODE) ×2 IMPLANT
COVER MAYO STAND STRL (DRAPES) ×3 IMPLANT
COVER SURGICAL LIGHT HANDLE (MISCELLANEOUS) ×2 IMPLANT
DRAIN CHANNEL 19F RND (DRAIN) IMPLANT
DRAPE LAPAROSCOPIC ABDOMINAL (DRAPES) ×2 IMPLANT
DRAPE WARM FLUID 44X44 (DRAPE) ×2 IMPLANT
DRSG OPSITE POSTOP 4X8 (GAUZE/BANDAGES/DRESSINGS) ×1 IMPLANT
ELECT REM PT RETURN 15FT ADLT (MISCELLANEOUS) ×2 IMPLANT
EVACUATOR SILICONE 100CC (DRAIN) IMPLANT
GAUZE SPONGE 4X4 12PLY STRL (GAUZE/BANDAGES/DRESSINGS) ×2 IMPLANT
GLOVE BIOGEL M 8.0 STRL (GLOVE) ×2 IMPLANT
GOWN SPEC L4 XLG W/TWL (GOWN DISPOSABLE) ×2 IMPLANT
GOWN STRL REUS W/TWL XL LVL3 (GOWN DISPOSABLE) ×6 IMPLANT
HANDLE SUCTION POOLE (INSTRUMENTS) IMPLANT
KIT BASIN OR (CUSTOM PROCEDURE TRAY) ×2 IMPLANT
NS IRRIG 1000ML POUR BTL (IV SOLUTION) ×4 IMPLANT
PACK GENERAL/GYN (CUSTOM PROCEDURE TRAY) ×2 IMPLANT
SEALER TISSUE X1 CVD JAW (INSTRUMENTS) IMPLANT
SPONGE LAP 18X18 X RAY DECT (DISPOSABLE) ×4 IMPLANT
STAPLER VISISTAT 35W (STAPLE) ×2 IMPLANT
SUCTION POOLE HANDLE (INSTRUMENTS) ×2
SUT PDS AB 1 CTX 36 (SUTURE) IMPLANT
SUT PDS AB 1 TP1 96 (SUTURE) ×2 IMPLANT
SUT SILK 2 0 (SUTURE) ×2
SUT SILK 2 0 SH CR/8 (SUTURE) ×2 IMPLANT
SUT SILK 2-0 18XBRD TIE 12 (SUTURE) ×1 IMPLANT
SUT SILK 3 0 (SUTURE) ×2
SUT SILK 3 0 SH CR/8 (SUTURE) ×2 IMPLANT
SUT SILK 3-0 18XBRD TIE 12 (SUTURE) ×1 IMPLANT
SUT VIC AB 3-0 SH 18 (SUTURE) ×2 IMPLANT
SUT VICRYL 2 0 18  UND BR (SUTURE)
SUT VICRYL 2 0 18 UND BR (SUTURE) IMPLANT
TOWEL OR 17X26 10 PK STRL BLUE (TOWEL DISPOSABLE) ×2 IMPLANT
TOWEL OR NON WOVEN STRL DISP B (DISPOSABLE) ×2 IMPLANT
TRAY FOLEY W/METER SILVER 16FR (SET/KITS/TRAYS/PACK) ×2 IMPLANT

## 2017-07-13 NOTE — Anesthesia Procedure Notes (Signed)
Procedure Name: Intubation Date/Time: 07/13/2017 11:55 AM Performed by: Montel Clock Pre-anesthesia Checklist: Patient identified, Emergency Drugs available, Suction available, Patient being monitored and Timeout performed Patient Re-evaluated:Patient Re-evaluated prior to induction Oxygen Delivery Method: Circle system utilized Preoxygenation: Pre-oxygenation with 100% oxygen Induction Type: IV induction and Rapid sequence Laryngoscope Size: Mac and 3 Grade View: Grade II Tube type: Oral Tube size: 7.0 mm Number of attempts: 1 Airway Equipment and Method: Stylet Placement Confirmation: ETT inserted through vocal cords under direct vision,  positive ETCO2 and breath sounds checked- equal and bilateral Secured at: 21 cm Tube secured with: Tape Dental Injury: Teeth and Oropharynx as per pre-operative assessment

## 2017-07-13 NOTE — Anesthesia Preprocedure Evaluation (Addendum)
Anesthesia Evaluation  Patient identified by MRN, date of birth, ID band Patient awake    Reviewed: Allergy & Precautions, NPO status , Patient's Chart, lab work & pertinent test results  Airway Mallampati: II       Dental no notable dental hx. (+) Teeth Intact   Pulmonary neg pulmonary ROS,    Pulmonary exam normal breath sounds clear to auscultation       Cardiovascular hypertension, Pt. on medications Normal cardiovascular exam Rhythm:Regular Rate:Normal     Neuro/Psych negative neurological ROS  negative psych ROS   GI/Hepatic Neg liver ROS, GERD  Medicated and Controlled,NGT in place.   Endo/Other  negative endocrine ROS  Renal/GU   negative genitourinary   Musculoskeletal negative musculoskeletal ROS (+)   Abdominal NGT in place.  Peds  Hematology   Anesthesia Other Findings   Reproductive/Obstetrics                            Anesthesia Physical Anesthesia Plan  ASA: II  Anesthesia Plan: General   Post-op Pain Management:    Induction: Intravenous  PONV Risk Score and Plan: 4 or greater and Ondansetron, Dexamethasone, Propofol, Midazolam, Scopolamine patch - Pre-op and Treatment may vary due to age or medical condition  Airway Management Planned: Oral ETT  Additional Equipment:   Intra-op Plan:   Post-operative Plan: Extubation in OR  Informed Consent: I have reviewed the patients History and Physical, chart, labs and discussed the procedure including the risks, benefits and alternatives for the proposed anesthesia with the patient or authorized representative who has indicated his/her understanding and acceptance.     Plan Discussed with: CRNA and Surgeon  Anesthesia Plan Comments:         Anesthesia Quick Evaluation

## 2017-07-13 NOTE — Progress Notes (Signed)
Patient ID: [name], female   DOB: [birth year], 71 y.o.   MRN: [id number] [facility] Surgery Progress Note:   * No surgery date entered *  Subjective: Mental status is clear.  She is frustrated by lack of progress Objective: Vital signs in last 24 hours: Temp:  [98.5 F (36.9 C)-99.1 F (37.3 C)] 99.1 F (37.3 C) (07/22 0510) Pulse Rate:  [86-93] 88 (07/22 0510) Resp:  [18] 18 (07/22 0510) BP: (143-146)/(76-86) 145/86 (07/22 0510) SpO2:  [96 %-99 %] 96 % (07/22 0510)  Intake/Output from previous day: 07/21 0701 - 07/22 0700 In: 2000 [I.V.:2000] Out: 2250 [Urine:1000; Emesis/NG output:1250] Intake/Output this shift: No intake/output data recorded.  Physical Exam: Work of breathing is normal;  NG cannister is full without ice chips.  Abdomen is still distended without more tenderness  Lab Results:  Results for orders placed or performed during the hospital encounter of 07/10/17 (from the past 48 hour(s))  CBC     Status: Abnormal   Collection Time: 07/12/17  5:14 AM  Result Value Ref Range   WBC 9.0 4.0 - 10.5 K/uL   RBC 3.77 (L) 3.87 - 5.11 MIL/uL   Hemoglobin 12.2 12.0 - 15.0 g/dL   HCT 35.3 (L) 36.0 - 46.0 %   MCV 93.6 78.0 - 100.0 fL   MCH 32.4 26.0 - 34.0 pg   MCHC 34.6 30.0 - 36.0 g/dL   RDW 12.8 11.5 - 15.5 %   Platelets 259 150 - 400 K/uL  Basic metabolic panel     Status: Abnormal   Collection Time: 07/12/17  5:14 AM  Result Value Ref Range   Sodium 136 135 - 145 mmol/L   Potassium 3.6 3.5 - 5.1 mmol/L   Chloride 98 (L) 101 - 111 mmol/L   CO2 27 22 - 32 mmol/L   Glucose, Bld 156 (H) 65 - 99 mg/dL   BUN 19 6 - 20 mg/dL   Creatinine, Ser 0.56 0.44 - 1.00 mg/dL   Calcium 7.8 (L) 8.9 - 10.3 mg/dL   GFR calc non Af Amer >60 >60 mL/min   GFR calc Af Amer >60 >60 mL/min    Comment: (NOTE) The eGFR has been calculated using the CKD EPI equation. This calculation has not been validated in all clinical situations. eGFR's persistently <60 mL/min signify  possible Chronic Kidney Disease.    Anion gap 11 5 - 15    Radiology/Results: Dg Abd Portable 1v-small Bowel Obstruction Protocol-initial, 8 Hr Delay  Result Date: 07/11/2017 CLINICAL DATA:  SBO protocol EXAM: PORTABLE ABDOMEN - 1 VIEW COMPARISON:  Abdominal radiographs dated 07/10/2017. CT abdomen/pelvis dated 07/10/2017. FINDINGS: Enteric tube terminates in the proximal gastric body. Dilated loops of small bowel in the central abdomen, compatible with small bowel obstruction. This is similar to the prior yesterday. Cholecystectomy clips. IMPRESSION: Stable small bowel obstruction. Enteric tube terminates in the proximal gastric body. Electronically Signed   By: Julian Hy M.D.   On: 07/11/2017 18:41    Anti-infectives: Anti-infectives    None      Assessment/Plan: Problem List: Patient Active Problem List   Diagnosis Date Noted  . SBO (small bowel obstruction) ([place]) 07/10/2017    Persistent small bowel obstruction.  I have discussed this with her and her husband and will proceed with exploratory laparotomy.  Have added to schedule today (Sunday).   * No surgery date entered *    LOS: 2 days   Matt B. Hassell Done, MD, [facility], [place]. [phone]  beeper [phone]  07/13/2017 8:28 AM

## 2017-07-13 NOTE — Op Note (Signed)
NAME:  [name], [place]              ACCOUNT NO.:  [id number]  MEDICAL RECORD NO.:  [id number]  LOCATION:  1538                         [facility]  PHYSICIAN:  Thornton ParkMatthew B. Daphine DeutscherMartin, MD  DATE OF BIRTH:  09-Nov-1946  DATE OF PROCEDURE:  07/13/2017 DATE OF DISCHARGE:                              OPERATIVE REPORT   PREOPERATIVE DIAGNOSIS:  A 71 year old white female who has previously had a diagnosis of malrotation and multiple laparotomies for small bowel obstruction, the most recent one in 2009, by Dr. Colin BentonEarle.  The patient presented with small bowel obstruction that failed with NG suction.  PROCEDURE:  Exploratory laparotomy with enterolysis and over-sewing of serosal tear.  SURGEON:  Thornton ParkMatthew B. Daphine DeutscherMartin, MD  ASSISTANT:  Romie LeveeAlicia Thomas.  ANESTHESIA:  General endotracheal.  DESCRIPTION OF PROCEDURE:  Ms. [name] was taken to room 1 on Sunday, July 13, 2017, given general anesthesia.  Preoperatively, she received Ancef.  The abdomen was prepped with TechniCare and draped sterilely.  I excised her old scar around her umbilicus.  She had previously been closed with a running #1 Novofil.  The knot was found cut out and the suture was largely removed.  I entered the abdomen carefully and found a loop of bowel stuck to the anterior abdominal wall at the incision site. The bowel was markedly dilated, fluid filled.  We extended our laparotomy.  I mobilized the small bowel into the wound and went distally and found probably in the mid jejunum.  The bowel was twisted and there was an area of focal narrowing.  It appeared more chronic, but certainly was the source of her obstruction as everything downstream was markedly small and proximally, the bowel was markedly dilated.  That was relieved and we could successfully allow content to go across this, go downstream to fill the ileum.  We went proximally and up near the ligament of Treitz.  There was an area of bowel that appeared to  have serosal tears either from dilatation or from mobilization, although there was really no force used in pulling this out of its pocket.  This was reinforced with interrupted 3-0 Vicryl.  There was no evidence of any enterotomy or any spillage at all.  There was another area where there was a pocket within the mesentery and these adhesions were taken down and any bowel adhesions that could be a source of potential later obstruction were taken down.  Eventually, all this had been done.  The small intestine still lies on the right side, colon on the left.  We identified the duodenum.  With relaxation, we were able to get all the bowel back into the abdomen.  Sponge and needle counts were correct.  We closed the abdomen with double-stranded #1 PDS, tying the individual strands.  The wound was irrigated and closed with staples.  Abdominal binder was placed.  The patient seemed to tolerate the procedure well and was taken to the recovery room.  An NG tube was left in for further decompression.     Thornton ParkMatthew B. Daphine DeutscherMartin, MD     MBM/MEDQ  D:  07/13/2017  T:  07/13/2017  Job:  [id number]

## 2017-07-13 NOTE — Brief Op Note (Signed)
07/10/2017 - 07/13/2017  1:28 PM  PATIENT:  [name]  71 y.o. female  PRE-OPERATIVE DIAGNOSIS:  small bowel obstruction  POST-OPERATIVE DIAGNOSIS:  small bowel obstruction  PROCEDURE:  Procedure(s): EXPLORATORY LAPAROTOMY, ENTEROLYSIS (N/A)  SURGEON:  Surgeon(s) and Role:    Luretha Murphy* Murdock Jellison, MD - Primary    * Romie Leveehomas, Alicia, MD - Assisting  PHYSICIAN ASSISTANT:   ASSISTANTS: Romie Leveelicia Thomas   ANESTHESIA:   general  EBL:  Total I/O In: 1353.3 [I.V.:1353.3] Out: 875 [Emesis/NG output:200; Other:650; Blood:25]  BLOOD ADMINISTERED:none  DRAINS: none   LOCAL MEDICATIONS USED:  NONE  SPECIMEN:  No Specimen  DISPOSITION OF SPECIMEN:  N/A  COUNTS:  YES  TOURNIQUET:  * No tourniquets in log *  DICTATION: .Other Dictation: Dictation Number [id number]  PLAN OF CARE: return to her room on med surg floor  PATIENT DISPOSITION:  PACU - hemodynamically stable.   Delay start of Pharmacological VTE agent (>24hrs) due to surgical blood loss or risk of bleeding: no

## 2017-07-13 NOTE — Anesthesia Postprocedure Evaluation (Signed)
Anesthesia Post Note  Patient: [name]  Procedure(s) Performed: Procedure(s) (LRB): EXPLORATORY LAPAROTOMY, ENTEROLYSIS (N/A)     Patient location during evaluation: PACU Anesthesia Type: General Level of consciousness: awake Pain management: pain level controlled Vital Signs Assessment: post-procedure vital signs reviewed and stable Respiratory status: spontaneous breathing Cardiovascular status: stable Postop Assessment: no signs of nausea or vomiting Anesthetic complications: no    Last Vitals:  Vitals:   07/13/17 1400 07/13/17 1415  BP: (!) 150/82   Pulse:    Resp:    Temp:  36.8 C    Last Pain:  Vitals:   07/13/17 1415  TempSrc:   PainSc: 1    Pain Goal:                 Idrissa Beville JR,JOHN Kealan Buchan

## 2017-07-13 NOTE — Transfer of Care (Signed)
Immediate Anesthesia Transfer of Care Note  Patient: [name]  Procedure(s) Performed: Procedure(s): EXPLORATORY LAPAROTOMY, ENTEROLYSIS (N/A)  Patient Location: PACU  Anesthesia Type:General  Level of Consciousness:  sedated, patient cooperative and responds to stimulation  Airway & Oxygen Therapy:Patient Spontanous Breathing and Patient connected to face mask oxgen  Post-op Assessment:  Report given to PACU RN and Post -op Vital signs reviewed and stable  Post vital signs:  Reviewed and stable  Last Vitals:  Vitals:   07/13/17 0510 07/13/17 1329  BP: (!) 145/86 (!) 149/91  Pulse: 88 98  Resp: 18 18  Temp: 37.3 C     Complications: No apparent anesthesia complications

## 2017-07-14 ENCOUNTER — Encounter (HOSPITAL_COMMUNITY): Payer: Self-pay | Admitting: Surgery

## 2017-07-14 LAB — BASIC METABOLIC PANEL
Anion gap: 7 (ref 5–15)
BUN: 17 mg/dL (ref 6–20)
CALCIUM: 6.6 mg/dL — AB (ref 8.9–10.3)
CO2: 25 mmol/L (ref 22–32)
CREATININE: 0.5 mg/dL (ref 0.44–1.00)
Chloride: 101 mmol/L (ref 101–111)
Glucose, Bld: 163 mg/dL — ABNORMAL HIGH (ref 65–99)
Potassium: 3.4 mmol/L — ABNORMAL LOW (ref 3.5–5.1)
SODIUM: 133 mmol/L — AB (ref 135–145)

## 2017-07-14 LAB — CBC
HCT: 34.7 % — ABNORMAL LOW (ref 36.0–46.0)
Hemoglobin: 11.8 g/dL — ABNORMAL LOW (ref 12.0–15.0)
MCH: 32.2 pg (ref 26.0–34.0)
MCHC: 34 g/dL (ref 30.0–36.0)
MCV: 94.6 fL (ref 78.0–100.0)
PLATELETS: 238 10*3/uL (ref 150–400)
RBC: 3.67 MIL/uL — ABNORMAL LOW (ref 3.87–5.11)
RDW: 13.2 % (ref 11.5–15.5)
WBC: 9.1 10*3/uL (ref 4.0–10.5)

## 2017-07-14 NOTE — Progress Notes (Signed)
[facility]/Trauma Progress Note  1 Day Post-Op   Assessment/Plan Hypertension Macular edema secondary to retinal branch occlusion left eye Osteoporosis  SBO, recurrent - h/o Congenital Malrotation. SBO, with exploratory laparotomy with lysis of adhesions 5/2/2009Dr. Baruch MerlKristen Earle; Hx of hysterectomy and appendectomy - S/P Exploratory laparotomy, enterolysis, Dr. Daphine DeutscherMartin, 07/13/17 - 250 NGT output in last 24h  FEN: clamp NGT, sips from floor VTE: SCD's, heparin ID: ancef pre-op only Foley: dc today Follow up: Dr. Daphine DeutscherMartin in 2 weeks  DISPO: clamping trial, ambulate, sips from the floor, PT consult    LOS: 3 days    Subjective:  CC: abdominal pain  Pt is minimal, pt is not nauseated. No fevers. No acute events overnight. Pt has not ambulated yet.   Objective: Vital signs in last 24 hours: Temp:  [97.7 F (36.5 C)-99.2 F (37.3 C)] 98.6 F (37 C) (07/23 0400) Pulse Rate:  [85-104] 85 (07/23 0400) Resp:  [15-18] 15 (07/23 0400) BP: (121-152)/(72-91) 126/78 (07/23 0400) SpO2:  [98 %-100 %] 100 % (07/23 0400) Last BM Date: 07/01/17  Intake/Output from previous day: 07/22 0701 - 07/23 0700 In: 3380 [I.V.:3380] Out: 1625 [Urine:700; Emesis/NG output:250; Blood:25] Intake/Output this shift: No intake/output data recorded.  PE: Gen:  Alert, NAD, pleasant, cooperative, well appearing, NGT in place Card:  RRR, no M/G/R heard Pulm:  CTA anteriorly, no W/R/R, effort normal Abd: Soft, not distended, normal bowel sounds mixed with tinkling, incisions C/D/I, mild generalized TTP no guarding, binder replaced Skin: no rashes noted, warm and dry   Anti-infectives: Anti-infectives    Start     Dose/Rate Route Frequency Ordered Stop   07/13/17 2000  ceFAZolin (ANCEF) IVPB 2g/100 mL premix     2 g 200 mL/hr over 30 Minutes Intravenous Every 8 hours 07/13/17 1447 07/13/17 2156   07/13/17 1132  ceFAZolin (ANCEF) 2-4 GM/100ML-% IVPB    Comments:  Juanda Crumbleanney, Michael    : cabinet override      07/13/17 1132 07/13/17 1145      Lab Results:   Recent Labs  07/12/17 0514 07/14/17 0457  WBC 9.0 9.1  HGB 12.2 11.8*  HCT 35.3* 34.7*  PLT 259 238   BMET  Recent Labs  07/12/17 0514 07/14/17 0457  NA 136 133*  K 3.6 3.4*  CL 98* 101  CO2 27 25  GLUCOSE 156* 163*  BUN 19 17  CREATININE 0.56 0.50  CALCIUM 7.8* 6.6*   PT/INR No results for input(s): LABPROT, INR in the last 72 hours. CMP     Component Value Date/Time   NA 133 (L) 07/14/2017 0457   K 3.4 (L) 07/14/2017 0457   CL 101 07/14/2017 0457   CO2 25 07/14/2017 0457   GLUCOSE 163 (H) 07/14/2017 0457   BUN 17 07/14/2017 0457   CREATININE 0.50 07/14/2017 0457   CALCIUM 6.6 (L) 07/14/2017 0457   PROT 8.5 (H) 07/10/2017 1202   ALBUMIN 4.9 07/10/2017 1202   AST 39 07/10/2017 1202   ALT 24 07/10/2017 1202   ALKPHOS 56 07/10/2017 1202   BILITOT 3.0 (H) 07/10/2017 1202   GFRNONAA >60 07/14/2017 0457   GFRAA >60 07/14/2017 0457   Lipase     Component Value Date/Time   LIPASE 38 07/10/2017 1202    Studies/Results: No results found.    Jerre SimonJessica L Focht , [facility] 07/14/2017, 9:31 AM Pager: [phone] Consults: [phone] Mon-Fri 7:00 am-4:30 pm Sat-Sun 7:00 am-11:30 am

## 2017-07-14 NOTE — Evaluation (Signed)
Physical Therapy Evaluation-1x Patient Details Name: [name] MRN: [id number] DOB: 01-01-1946 Today's Date: 07/14/2017   History of Present Illness  71 yo female s/p exp lap 7/22. Hx of osteoporosis, macular edema, HTN, multiple laparotomies, SBO.   Clinical Impression  Pt initially declined PT eval-stated she was already walking. She did not feel she needed any PT assistance for ambulation. However, she did request education on bed mobility technique. Brief session to review and practice logroll technique for supine to sit and sit to supine. Encouraged pt to continue to practice during hospital stay. Pt reported she will walk with either her husband for nursing staff. Will sign off at this time. Informed pt that she could have MD reorder if needs change. 1x eval.     Follow Up Recommendations No PT follow up    Equipment Recommendations  None recommended by PT    Recommendations for Other Services       Precautions / Restrictions Precautions Precaution Comments: abd surgery, NG tube, abdominal binder Restrictions Weight Bearing Restrictions: No      Mobility  Bed Mobility Overal bed mobility: Needs Assistance Bed Mobility: Rolling;Sidelying to Sit;Sit to Sidelying Rolling: Supervision Sidelying to sit: Supervision Sit to sidelying: Supervision General bed mobility comments: HOB flat. Educated pt on logroll technique. Practiced side to sit then sit to side. Increased time. VCs safety, technique, sequence.   Transfers                 General transfer comment: NT-pt declined. She has already been up and walking. Will continue to do so with husband or staff.   Ambulation/Gait                Stairs            Wheelchair Mobility    Modified Rankin (Stroke Patients Only)       Balance                                             Pertinent Vitals/Pain Pain Assessment: Faces Faces Pain Scale: Hurts little more Pain  Location: abdomen with activity Pain Descriptors / Indicators: Sore;Tender Pain Intervention(s): Limited activity within patient's tolerance;Repositioned    Home Living Family/patient expects to be discharged to:: Private residence Living Arrangements: Spouse/significant other   Type of Home: House         Home Equipment: None      Prior Function Level of Independence: Independent               Hand Dominance        Extremity/Trunk Assessment   Upper Extremity Assessment Upper Extremity Assessment: Overall WFL for tasks assessed    Lower Extremity Assessment Lower Extremity Assessment: Overall WFL for tasks assessed    Cervical / Trunk Assessment Cervical / Trunk Assessment: Normal  Communication   Communication: No difficulties  Cognition Arousal/Alertness: Awake/alert Behavior During Therapy: WFL for tasks assessed/performed Overall Cognitive Status: Within Functional Limits for tasks assessed                                        General Comments      Exercises     Assessment/Plan    PT Assessment Patent does not need any further PT services  PT Problem List  PT Treatment Interventions      PT Goals (Current goals can be found in the Care Plan section)  Acute Rehab PT Goals Patient Stated Goal: regain PLOF. less pain with transitional movements PT Goal Formulation: All assessment and education complete, DC therapy    Frequency     Barriers to discharge        Co-evaluation               AM-PAC PT "6 Clicks" Daily Activity  Outcome Measure Difficulty turning over in bed (including adjusting bedclothes, sheets and blankets)?: A Little Difficulty moving from lying on back to sitting on the side of the bed? : A Little Difficulty sitting down on and standing up from a chair with arms (e.g., wheelchair, bedside commode, etc,.)?: A Little Help needed moving to and from a bed to chair (including a wheelchair)?:  A Little Help needed walking in hospital room?: A Little Help needed climbing 3-5 steps with a railing? : A Little 6 Click Score: 18    End of Session   Activity Tolerance: Patient limited by pain Patient left: in bed;with call bell/phone within reach   PT Visit Diagnosis: Difficulty in walking, not elsewhere classified (R26.2)    Time: 1610-9604 PT Time Calculation (min) (ACUTE ONLY): 9 min   Charges:   PT Evaluation $PT Eval Low Complexity: 1 Procedure     PT G Codes:          Rebeca Alert, MPT Pager: [phone]

## 2017-07-15 LAB — BASIC METABOLIC PANEL
Anion gap: 5 (ref 5–15)
BUN: 10 mg/dL (ref 6–20)
CALCIUM: 6.8 mg/dL — AB (ref 8.9–10.3)
CO2: 28 mmol/L (ref 22–32)
Chloride: 99 mmol/L — ABNORMAL LOW (ref 101–111)
Creatinine, Ser: 0.39 mg/dL — ABNORMAL LOW (ref 0.44–1.00)
Glucose, Bld: 114 mg/dL — ABNORMAL HIGH (ref 65–99)
Potassium: 3.2 mmol/L — ABNORMAL LOW (ref 3.5–5.1)
SODIUM: 132 mmol/L — AB (ref 135–145)

## 2017-07-15 LAB — CBC
HCT: 34.2 % — ABNORMAL LOW (ref 36.0–46.0)
Hemoglobin: 11.6 g/dL — ABNORMAL LOW (ref 12.0–15.0)
MCH: 32 pg (ref 26.0–34.0)
MCHC: 33.9 g/dL (ref 30.0–36.0)
MCV: 94.5 fL (ref 78.0–100.0)
Platelets: 231 10*3/uL (ref 150–400)
RBC: 3.62 MIL/uL — ABNORMAL LOW (ref 3.87–5.11)
RDW: 13.1 % (ref 11.5–15.5)
WBC: 7.4 10*3/uL (ref 4.0–10.5)

## 2017-07-15 NOTE — Progress Notes (Signed)
Assessment Principal Problem:   SBO status post exploratory laparotomy 07/13/17 (Dr. Elvina MattesMartin)-postop ileus as expected.   Plan:  Frequent ambulation and up in chair.  Wait for return of bowel function.   LOS: 4 days     2 Days Post-Op  Chief Complaint/Subjective: No flatus or BM.  Pain is better.  Objective: Vital signs in last 24 hours: Temp:  [97.7 F (36.5 C)-98.8 F (37.1 C)] 97.7 F (36.5 C) (07/24 0501) Pulse Rate:  [95-99] 95 (07/24 0501) Resp:  [16-18] 16 (07/24 0501) BP: (141-142)/(82-92) 141/92 (07/24 0501) SpO2:  [97 %-100 %] 97 % (07/24 0501) Last BM Date: 07/01/17  Intake/Output from previous day: 07/23 0701 - 07/24 0700 In: 2586.3 [P.O.:118; I.V.:2468.3] Out: 1450 [Urine:1400; Emesis/NG output:50] Intake/Output this shift: No intake/output data recorded.  PE: General- In NAD.  Awake and alert. Abdomen-soft, some distension, wound is clean and intact, hypoactive bowel sounds  Lab Results:   Recent Labs  07/14/17 0457 07/15/17 0506  WBC 9.1 7.4  HGB 11.8* 11.6*  HCT 34.7* 34.2*  PLT 238 231   BMET  Recent Labs  07/14/17 0457 07/15/17 0506  NA 133* 132*  K 3.4* 3.2*  CL 101 99*  CO2 25 28  GLUCOSE 163* 114*  BUN 17 10  CREATININE 0.50 0.39*  CALCIUM 6.6* 6.8*   PT/INR No results for input(s): LABPROT, INR in the last 72 hours. Comprehensive Metabolic Panel:    Component Value Date/Time   NA 132 (L) 07/15/2017 0506   NA 133 (L) 07/14/2017 0457   K 3.2 (L) 07/15/2017 0506   K 3.4 (L) 07/14/2017 0457   CL 99 (L) 07/15/2017 0506   CL 101 07/14/2017 0457   CO2 28 07/15/2017 0506   CO2 25 07/14/2017 0457   BUN 10 07/15/2017 0506   BUN 17 07/14/2017 0457   CREATININE 0.39 (L) 07/15/2017 0506   CREATININE 0.50 07/14/2017 0457   GLUCOSE 114 (H) 07/15/2017 0506   GLUCOSE 163 (H) 07/14/2017 0457   CALCIUM 6.8 (L) 07/15/2017 0506   CALCIUM 6.6 (L) 07/14/2017 0457   AST 39 07/10/2017 1202   AST 41 (H) 10/18/2008 0450   ALT 24  07/10/2017 1202   ALT 37 (H) 10/18/2008 0450   ALKPHOS 56 07/10/2017 1202   ALKPHOS 79 10/18/2008 0450   BILITOT 3.0 (H) 07/10/2017 1202   BILITOT 1.6 (H) 10/18/2008 0450   PROT 8.5 (H) 07/10/2017 1202   PROT 8.2 10/18/2008 0450   ALBUMIN 4.9 07/10/2017 1202   ALBUMIN 4.7 10/18/2008 0450     Studies/Results: No results found.  Anti-infectives: Anti-infectives    Start     Dose/Rate Route Frequency Ordered Stop   07/13/17 2000  ceFAZolin (ANCEF) IVPB 2g/100 mL premix     2 g 200 mL/hr over 30 Minutes Intravenous Every 8 hours 07/13/17 1447 07/13/17 2156   07/13/17 1132  ceFAZolin (ANCEF) 2-4 GM/100ML-% IVPB    Comments:  Juanda CrumbleNanney, Michael   : cabinet override      07/13/17 1132 07/13/17 1145       Ayako Tapanes J 07/15/2017

## 2017-07-15 NOTE — Care Management Important Message (Signed)
Important Message  Patient Details  Name: [name] MRN: [id number] Date of Birth: 09-Sep-1946   Medicare Important Message Given:  Yes    Caren MacadamFuller, Nicle Connole 07/15/2017, 9:35 AMImportant Message  Patient Details  Name: [name] MRN: [id number] Date of Birth: 09-Sep-1946   Medicare Important Message Given:  Yes    Caren MacadamFuller, Jayten Gabbard 07/15/2017, 9:35 AM

## 2017-07-16 NOTE — Progress Notes (Signed)
Patient had hypokalemia 07/15/17

## 2017-07-16 NOTE — Progress Notes (Signed)
  Progress Note: General Surgery Service   Assessment/Plan: Patient Active Problem List   Diagnosis Date Noted  . Status post exploratory laparotomy 2018 07/13/2017   s/p Procedure(s): EXPLORATORY LAPAROTOMY, ENTEROLYSIS 07/13/2017 -clamp NG today, will no plan to remove until return bowel function -reopen NG tube for evenings    LOS: 5 days  Chief Complaint/Subjective: No nausea, no flatus or BM  Objective: Vital signs in last 24 hours: Temp:  [98 F (36.7 C)-98.6 F (37 C)] 98.6 F (37 C) (07/25 0601) Pulse Rate:  [68-96] 90 (07/25 0601) Resp:  [17-18] 18 (07/25 0601) BP: (139-144)/(85-97) 139/86 (07/25 0601) SpO2:  [96 %-99 %] 96 % (07/25 0601) Last BM Date: 07/01/17  Intake/Output from previous day: 07/24 0701 - 07/25 0700 In: 2580 [P.O.:60; I.V.:2400; NG/GT:120] Out: 5250 [Urine:5250] Intake/Output this shift: No intake/output data recorded.  Lungs: CTAB  Cardiovascular: RRR  Abd: soft, slight distension (per patient less than yesterday), NT  Extremities: no edema  Neuro: AOx4  Lab Results: CBC   Recent Labs  07/14/17 0457 07/15/17 0506  WBC 9.1 7.4  HGB 11.8* 11.6*  HCT 34.7* 34.2*  PLT 238 231   BMET  Recent Labs  07/14/17 0457 07/15/17 0506  NA 133* 132*  K 3.4* 3.2*  CL 101 99*  CO2 25 28  GLUCOSE 163* 114*  BUN 17 10  CREATININE 0.50 0.39*  CALCIUM 6.6* 6.8*   PT/INR No results for input(s): LABPROT, INR in the last 72 hours. ABG No results for input(s): PHART, HCO3 in the last 72 hours.  Invalid input(s): PCO2, PO2  Studies/Results:  Anti-infectives: Anti-infectives    Start     Dose/Rate Route Frequency Ordered Stop   07/13/17 2000  ceFAZolin (ANCEF) IVPB 2g/100 mL premix     2 g 200 mL/hr over 30 Minutes Intravenous Every 8 hours 07/13/17 1447 07/13/17 2156   07/13/17 1132  ceFAZolin (ANCEF) 2-4 GM/100ML-% IVPB    Comments:  Juanda Crumbleanney, Michael   : cabinet override      07/13/17 1132 07/13/17 1145       Medications: Scheduled Meds: . cycloSPORINE  1 drop Both Eyes BID  . heparin  5,000 Units Subcutaneous Q8H  . pantoprazole (PROTONIX) IV  40 mg Intravenous QHS   Continuous Infusions: . dextrose 5 % and 0.45 % NaCl with KCl 20 mEq/L 1,000 mL (07/16/17 0114)   PRN Meds:.diphenhydrAMINE **OR** diphenhydrAMINE, hydrALAZINE, morphine injection, ondansetron **OR** ondansetron (ZOFRAN) IV  Rodman PickleLuke Aaron Kinsinger, MD Pg# [phone] [facility]

## 2017-07-17 LAB — CBC
HCT: 32.3 % — ABNORMAL LOW (ref 36.0–46.0)
Hemoglobin: 11.5 g/dL — ABNORMAL LOW (ref 12.0–15.0)
MCH: 31.9 pg (ref 26.0–34.0)
MCHC: 35.6 g/dL (ref 30.0–36.0)
MCV: 89.7 fL (ref 78.0–100.0)
PLATELETS: 278 10*3/uL (ref 150–400)
RBC: 3.6 MIL/uL — AB (ref 3.87–5.11)
RDW: 12.5 % (ref 11.5–15.5)
WBC: 6.6 10*3/uL (ref 4.0–10.5)

## 2017-07-17 LAB — MAGNESIUM: MAGNESIUM: 1.5 mg/dL — AB (ref 1.7–2.4)

## 2017-07-17 LAB — BASIC METABOLIC PANEL
Anion gap: 6 (ref 5–15)
CHLORIDE: 100 mmol/L — AB (ref 101–111)
CO2: 28 mmol/L (ref 22–32)
CREATININE: 0.37 mg/dL — AB (ref 0.44–1.00)
Calcium: 6.8 mg/dL — ABNORMAL LOW (ref 8.9–10.3)
GFR calc Af Amer: >60 mL/min (ref >60)
GLUCOSE: 117 mg/dL — AB (ref 65–99)
Potassium: 2.6 mmol/L — CL (ref 3.5–5.1)
SODIUM: 134 mmol/L — AB (ref 135–145)

## 2017-07-17 MED ORDER — HYDROCORTISONE 2.5 % RE CREA: 1.0000 "application " | TOPICAL_CREAM | Freq: Four times a day (QID) | RECTAL | Status: DC | PRN

## 2017-07-17 MED ORDER — HYDROCORTISONE 1 % EX CREA
1.0000 | TOPICAL_CREAM | Freq: Three times a day (TID) | CUTANEOUS | Status: DC | PRN
Start: 2017-07-17 — End: 2017-07-20

## 2017-07-17 MED ORDER — MAGNESIUM SULFATE IN D5W 1-5 GM/100ML-% IV SOLN
1.0000 g | Freq: Once | INTRAVENOUS | Status: AC
  Administered 2017-07-17: 1 g via INTRAVENOUS
  Filled 2017-07-17: qty 100

## 2017-07-17 MED ORDER — LIP MEDEX EX OINT
1.0000 "application " | TOPICAL_OINTMENT | Freq: Two times a day (BID) | CUTANEOUS | Status: DC
  Administered 2017-07-17 – 2017-07-18 (×3): 1 via TOPICAL

## 2017-07-17 MED ORDER — METRONIDAZOLE 0.75 % EX GEL
1.0000 "application " | Freq: Two times a day (BID) | CUTANEOUS | Status: DC
  Administered 2017-07-17 – 2017-07-18 (×2): 1 via TOPICAL
  Filled 2017-07-17: qty 45

## 2017-07-17 MED ORDER — ALUM & MAG HYDROXIDE-SIMETH 200-200-20 MG/5ML PO SUSP: 30.0000 mL | Freq: Four times a day (QID) | ORAL | Status: DC | PRN

## 2017-07-17 MED ORDER — SIMETHICONE 80 MG PO CHEW: 80.0000 mg | CHEWABLE_TABLET | Freq: Four times a day (QID) | ORAL | Status: DC | PRN

## 2017-07-17 MED ORDER — BISACODYL 10 MG RE SUPP
10.0000 mg | Freq: Every day | RECTAL | Status: DC
  Administered 2017-07-17: 10 mg via RECTAL
  Filled 2017-07-17: qty 1

## 2017-07-17 MED ORDER — GUAIFENESIN-DM 100-10 MG/5ML PO SYRP: 10.0000 mL | ORAL_SOLUTION | ORAL | Status: DC | PRN

## 2017-07-17 MED ORDER — MAGIC MOUTHWASH
15.0000 mL | Freq: Four times a day (QID) | ORAL | Status: DC | PRN
  Filled 2017-07-17: qty 15

## 2017-07-17 MED ORDER — ACETAMINOPHEN 650 MG RE SUPP: 650.0000 mg | Freq: Four times a day (QID) | RECTAL | Status: DC | PRN

## 2017-07-17 MED ORDER — PROCHLORPERAZINE EDISYLATE 5 MG/ML IJ SOLN: 5.0000 mg | INTRAMUSCULAR | Status: DC | PRN

## 2017-07-17 MED ORDER — METHOCARBAMOL 1000 MG/10ML IJ SOLN
1000.0000 mg | Freq: Four times a day (QID) | INTRAVENOUS | Status: DC | PRN
  Filled 2017-07-17: qty 10

## 2017-07-17 MED ORDER — MENTHOL 3 MG MT LOZG: 1.0000 | LOZENGE | OROMUCOSAL | Status: DC | PRN

## 2017-07-17 MED ORDER — METOPROLOL TARTRATE 5 MG/5ML IV SOLN: 5.0000 mg | Freq: Four times a day (QID) | INTRAVENOUS | Status: DC | PRN

## 2017-07-17 MED ORDER — PHENOL 1.4 % MT LIQD: 1.0000 | OROMUCOSAL | Status: DC | PRN

## 2017-07-17 MED ORDER — POTASSIUM CHLORIDE 10 MEQ/100ML IV SOLN
10.0000 meq | INTRAVENOUS | Status: AC
  Administered 2017-07-17 (×4): 10 meq via INTRAVENOUS
  Filled 2017-07-17 (×5): qty 100

## 2017-07-17 NOTE — Progress Notes (Signed)
Notified Md on call about critical Potassium level of 2.6 this am.

## 2017-07-17 NOTE — Progress Notes (Signed)
Assessment Principal Problem:   SBO status post exploratory laparotomy 07/13/17 (Dr. Elvina MattesMartin)-postop ileus continues  Hypokalemia-may be contributing to ileus   Plan:  IV potassium replacement. Check magnesium.  Wait for return of bowel function.  LOS: 6 days     4 Days Post-Op  Chief Complaint/Subjective: No flatus or BM. Walking.  Objective: Vital signs in last 24 hours: Temp:  [97.4 F (36.3 C)-98 F (36.7 C)] 97.4 F (36.3 C) (07/26 0442) Pulse Rate:  [88-98] 98 (07/26 0442) Resp:  [18] 18 (07/26 0442) BP: (134-144)/(85-89) 134/85 (07/26 0442) SpO2:  [100 %] 100 % (07/26 0442) Last BM Date: 07/01/17  Intake/Output from previous day: 07/25 0701 - 07/26 0700 In: 2484.7 [P.O.:208; I.V.:2276.7] Out: 3300 [Urine:3300] Intake/Output this shift: No intake/output data recorded.  PE: General- In NAD.  Awake and alert. Abdomen-soft, some distension, wound is clean and intact, rare bowel sounds  Lab Results:   Recent Labs  07/15/17 0506 07/17/17 0531  WBC 7.4 6.6  HGB 11.6* 11.5*  HCT 34.2* 32.3*  PLT 231 278   BMET  Recent Labs  07/15/17 0506 07/17/17 0531  NA 132* 134*  K 3.2* 2.6*  CL 99* 100*  CO2 28 28  GLUCOSE 114* 117*  BUN 10 <5*  CREATININE 0.39* 0.37*  CALCIUM 6.8* 6.8*   PT/INR No results for input(s): LABPROT, INR in the last 72 hours. Comprehensive Metabolic Panel:    Component Value Date/Time   NA 134 (L) 07/17/2017 0531   NA 132 (L) 07/15/2017 0506   K 2.6 (LL) 07/17/2017 0531   K 3.2 (L) 07/15/2017 0506   CL 100 (L) 07/17/2017 0531   CL 99 (L) 07/15/2017 0506   CO2 28 07/17/2017 0531   CO2 28 07/15/2017 0506   BUN <5 (L) 07/17/2017 0531   BUN 10 07/15/2017 0506   CREATININE 0.37 (L) 07/17/2017 0531   CREATININE 0.39 (L) 07/15/2017 0506   GLUCOSE 117 (H) 07/17/2017 0531   GLUCOSE 114 (H) 07/15/2017 0506   CALCIUM 6.8 (L) 07/17/2017 0531   CALCIUM 6.8 (L) 07/15/2017 0506   AST 39 07/10/2017 1202   AST 41 (H) 10/18/2008 0450   ALT 24 07/10/2017 1202   ALT 37 (H) 10/18/2008 0450   ALKPHOS 56 07/10/2017 1202   ALKPHOS 79 10/18/2008 0450   BILITOT 3.0 (H) 07/10/2017 1202   BILITOT 1.6 (H) 10/18/2008 0450   PROT 8.5 (H) 07/10/2017 1202   PROT 8.2 10/18/2008 0450   ALBUMIN 4.9 07/10/2017 1202   ALBUMIN 4.7 10/18/2008 0450     Studies/Results: No results found.  Anti-infectives: Anti-infectives    Start     Dose/Rate Route Frequency Ordered Stop   07/13/17 2000  ceFAZolin (ANCEF) IVPB 2g/100 mL premix     2 g 200 mL/hr over 30 Minutes Intravenous Every 8 hours 07/13/17 1447 07/13/17 2156   07/13/17 1132  ceFAZolin (ANCEF) 2-4 GM/100ML-% IVPB    Comments:  Juanda CrumbleNanney, Michael   : cabinet override      07/13/17 1132 07/13/17 1145       Marialuisa Basara J 07/17/2017

## 2017-07-17 NOTE — Progress Notes (Signed)
Patient ID: [name], female   DOB: Jan 31, 1946, 71 y.o.   MRN: [id number]   Magnesium a little low, 1.5. Will replace in IVF.  BROOKE A MEUTH

## 2017-07-18 ENCOUNTER — Encounter (HOSPITAL_COMMUNITY): Payer: Self-pay | Admitting: Surgery

## 2017-07-18 LAB — BASIC METABOLIC PANEL
Anion gap: 7 (ref 5–15)
CHLORIDE: 102 mmol/L (ref 101–111)
CO2: 25 mmol/L (ref 22–32)
CREATININE: 0.39 mg/dL — AB (ref 0.44–1.00)
Calcium: 6.9 mg/dL — ABNORMAL LOW (ref 8.9–10.3)
GFR calc Af Amer: >60 mL/min (ref >60)
GFR calc non Af Amer: >60 mL/min (ref >60)
GLUCOSE: 127 mg/dL — AB (ref 65–99)
POTASSIUM: 3.2 mmol/L — AB (ref 3.5–5.1)
Sodium: 134 mmol/L — ABNORMAL LOW (ref 135–145)

## 2017-07-18 MED ORDER — POTASSIUM CHLORIDE 10 MEQ/100ML IV SOLN
10.0000 meq | INTRAVENOUS | Status: AC
  Administered 2017-07-18 (×5): 10 meq via INTRAVENOUS
  Filled 2017-07-18 (×5): qty 100

## 2017-07-18 MED ORDER — ENOXAPARIN SODIUM 40 MG/0.4ML ~~LOC~~ SOLN
40.0000 mg | SUBCUTANEOUS | Status: DC
  Administered 2017-07-18 – 2017-07-19 (×2): 40 mg via SUBCUTANEOUS
  Filled 2017-07-18 (×3): qty 0.4

## 2017-07-18 NOTE — Progress Notes (Signed)
Assessment Principal Problem:   SBO status post exploratory laparotomy 07/13/17 (Dr. Nadene RubinsMartin)-bowel function returning      Hypokalemia better but not wnl yet.   Plan:  D/C ng tube.  Clear liquid diet.  Replace potassium.  LOS: 7 days     5 Days Post-Op  Chief Complaint/Subjective: No flatus or BM.  Pain is better.  Objective: Vital signs in last 24 hours: Temp:  [97.7 F (36.5 C)-98.5 F (36.9 C)] 98.5 F (36.9 C) (07/27 0539) Pulse Rate:  [87-93] 87 (07/27 0539) Resp:  [18] 18 (07/27 0539) BP: (143-147)/(87-94) 143/93 (07/27 0539) SpO2:  [99 %-100 %] 99 % (07/27 0539) Last BM Date: 07/17/17  Intake/Output from previous day: 07/26 0701 - 07/27 0700 In: 2100 [I.V.:2100] Out: 4051 [Urine:3000; Stool:1051] Intake/Output this shift: No intake/output data recorded.  PE: General- In NAD.  Awake and alert. Abdomen-soft, less distension, wound is clean and intact, few bowel sounds  Lab Results:   Recent Labs  07/17/17 0531  WBC 6.6  HGB 11.5*  HCT 32.3*  PLT 278   BMET  Recent Labs  07/17/17 0531 07/18/17 0500  NA 134* 134*  K 2.6* 3.2*  CL 100* 102  CO2 28 25  GLUCOSE 117* 127*  BUN <5* <5*  CREATININE 0.37* 0.39*  CALCIUM 6.8* 6.9*   PT/INR No results for input(s): LABPROT, INR in the last 72 hours. Comprehensive Metabolic Panel:    Component Value Date/Time   NA 134 (L) 07/18/2017 0500   NA 134 (L) 07/17/2017 0531   K 3.2 (L) 07/18/2017 0500   K 2.6 (LL) 07/17/2017 0531   CL 102 07/18/2017 0500   CL 100 (L) 07/17/2017 0531   CO2 25 07/18/2017 0500   CO2 28 07/17/2017 0531   BUN <5 (L) 07/18/2017 0500   BUN <5 (L) 07/17/2017 0531   CREATININE 0.39 (L) 07/18/2017 0500   CREATININE 0.37 (L) 07/17/2017 0531   GLUCOSE 127 (H) 07/18/2017 0500   GLUCOSE 117 (H) 07/17/2017 0531   CALCIUM 6.9 (L) 07/18/2017 0500   CALCIUM 6.8 (L) 07/17/2017 0531   AST 39 07/10/2017 1202   AST 41 (H) 10/18/2008 0450   ALT 24 07/10/2017 1202   ALT 37 (H)  10/18/2008 0450   ALKPHOS 56 07/10/2017 1202   ALKPHOS 79 10/18/2008 0450   BILITOT 3.0 (H) 07/10/2017 1202   BILITOT 1.6 (H) 10/18/2008 0450   PROT 8.5 (H) 07/10/2017 1202   PROT 8.2 10/18/2008 0450   ALBUMIN 4.9 07/10/2017 1202   ALBUMIN 4.7 10/18/2008 0450     Studies/Results: No results found.  Anti-infectives: Anti-infectives    Start     Dose/Rate Route Frequency Ordered Stop   07/13/17 2000  ceFAZolin (ANCEF) IVPB 2g/100 mL premix     2 g 200 mL/hr over 30 Minutes Intravenous Every 8 hours 07/13/17 1447 07/13/17 2156   07/13/17 1132  ceFAZolin (ANCEF) 2-4 GM/100ML-% IVPB    Comments:  Juanda CrumbleNanney, Michael   : cabinet override      07/13/17 1132 07/13/17 1145       Jalexa Pifer J 07/18/2017

## 2017-07-19 LAB — BASIC METABOLIC PANEL
ANION GAP: 6 (ref 5–15)
BUN: <5 mg/dL — ABNORMAL LOW (ref 6–20)
CO2: 23 mmol/L (ref 22–32)
Calcium: 6.9 mg/dL — ABNORMAL LOW (ref 8.9–10.3)
Chloride: 105 mmol/L (ref 101–111)
Creatinine, Ser: 0.4 mg/dL — ABNORMAL LOW (ref 0.44–1.00)
GFR calc Af Amer: >60 mL/min (ref >60)
GLUCOSE: 102 mg/dL — AB (ref 65–99)
POTASSIUM: 3.9 mmol/L (ref 3.5–5.1)
Sodium: 134 mmol/L — ABNORMAL LOW (ref 135–145)

## 2017-07-19 NOTE — Progress Notes (Signed)
6 Days Post-Op   Subjective/Chief Complaint: No complaints. Just started clears but seems to have tolerated well   Objective: Vital signs in last 24 hours: Temp:  [97.7 F (36.5 C)-98.4 F (36.9 C)] 98.4 F (36.9 C) (07/28 0500) Pulse Rate:  [79-85] 79 (07/28 0500) Resp:  [16-18] 16 (07/28 0500) BP: (124-149)/(80-94) 127/84 (07/28 0500) SpO2:  [99 %-100 %] 99 % (07/28 0500) Last BM Date: 07/18/17  Intake/Output from previous day: 07/27 0701 - 07/28 0700 In: 2673.8 [P.O.:410; I.V.:1863.8; IV Piggyback:400] Out: 2025 [Urine:2025] Intake/Output this shift: Total I/O In: 875 [I.V.:875] Out: 1000 [Urine:1000]  General appearance: alert and cooperative Resp: clear to auscultation bilaterally Cardio: regular rate and rhythm GI: soft, mild tenderness  Lab Results:   Recent Labs  07/17/17 0531  WBC 6.6  HGB 11.5*  HCT 32.3*  PLT 278   BMET  Recent Labs  07/18/17 0500 07/19/17 0513  NA 134* 134*  K 3.2* 3.9  CL 102 105  CO2 25 23  GLUCOSE 127* 102*  BUN <5* <5*  CREATININE 0.39* 0.40*  CALCIUM 6.9* 6.9*   PT/INR No results for input(Dorsey): LABPROT, INR in the last 72 hours. ABG No results for input(Dorsey): PHART, HCO3 in the last 72 hours.  Invalid input(Dorsey): PCO2, PO2  Studies/Results: No results found.  Anti-infectives: Anti-infectives    Start     Dose/Rate Route Frequency Ordered Stop   07/13/17 2000  ceFAZolin (ANCEF) IVPB 2g/100 mL premix     2 g 200 mL/hr over 30 Minutes Intravenous Every 8 hours 07/13/17 1447 07/13/17 2156   07/13/17 1132  ceFAZolin (ANCEF) 2-4 GM/100ML-% IVPB    Comments:  [name], [name]   : cabinet override      07/13/17 1132 07/13/17 1145      Assessment/Plan: Dorsey/p Procedure(Dorsey): EXPLORATORY LAPAROTOMY, ENTEROLYSIS (N/A) Advance diet. Start fulls today Ambulate POD 6  LOS: 8 days    [name],[name] 07/19/2017

## 2017-07-20 MED ORDER — HYDROCODONE-ACETAMINOPHEN 5-325 MG PO TABS: 1.0000 | ORAL_TABLET | ORAL | 0 refills | Status: DC | PRN

## 2017-07-20 NOTE — Progress Notes (Signed)
7 Days Post-Op   Subjective/Chief Complaint: Tolerating diet. No complaints. Wants to go home   Objective: Vital signs in last 24 hours: Temp:  [97.5 F (36.4 C)-98.5 F (36.9 C)] 98.5 F (36.9 C) (07/29 0531) Pulse Rate:  [83-98] 85 (07/29 0531) Resp:  [16] 16 (07/29 0531) BP: (132-158)/(82-95) 132/82 (07/29 0531) SpO2:  [100 %] 100 % (07/29 0531) Last BM Date: 07/19/17  Intake/Output from previous day: 07/28 0701 - 07/29 0700 In: 2160 [P.O.:360; I.V.:1800] Out: 2950 [Urine:2650; Stool:300] Intake/Output this shift: Total I/O In: -  Out: 600 [Urine:600]  General appearance: alert and cooperative Resp: clear to auscultation bilaterally Cardio: regular rate and rhythm GI: soft, nontender. incisions look good. having bm's  Lab Results:  No results for input(s): WBC, HGB, HCT, PLT in the last 72 hours. BMET  Recent Labs  07/18/17 0500 07/19/17 0513  NA 134* 134*  K 3.2* 3.9  CL 102 105  CO2 25 23  GLUCOSE 127* 102*  BUN <5* <5*  CREATININE 0.39* 0.40*  CALCIUM 6.9* 6.9*   PT/INR No results for input(s): LABPROT, INR in the last 72 hours. ABG No results for input(s): PHART, HCO3 in the last 72 hours.  Invalid input(s): PCO2, PO2  Studies/Results: No results found.  Anti-infectives: Anti-infectives    Start     Dose/Rate Route Frequency Ordered Stop   07/13/17 2000  ceFAZolin (ANCEF) IVPB 2g/100 mL premix     2 g 200 mL/hr over 30 Minutes Intravenous Every 8 hours 07/13/17 1447 07/13/17 2156   07/13/17 1132  ceFAZolin (ANCEF) 2-4 GM/100ML-% IVPB    Comments:  Juanda Crumbleanney, Michael   : cabinet override      07/13/17 1132 07/13/17 1145      Assessment/Plan: s/p Procedure(s): EXPLORATORY LAPAROTOMY, ENTEROLYSIS (N/A) Advance diet  If she continues to do well then will plan for discharge later today  LOS: 9 days    TOTH III,Reshanda Lewey S 07/20/2017

## 2017-07-20 NOTE — Progress Notes (Signed)
Nurse reviewed discharge instructions with pt.   Pt verbalized understanding of discharge instructions, follow up appointment and new medication.  Prescription given to pt prior to discharge. 

## 2017-07-22 NOTE — Discharge Summary (Signed)
[facility] Discharge Summary   Patient ID: Allison Coupnne Tagle MRN: [id number] DOB/AGE: 1946-11-13 71 y.o.  Admit date: 07/10/2017 Discharge date: 07/22/2017  Admitting Diagnosis: SBO  Discharge Diagnosis Patient Active Problem List   Diagnosis Date Noted  . Status post exploratory laparotomy 2018 07/13/2017  . SBO (small bowel obstruction), recurrent, s/p adhesiolysis 07/13/2017 07/10/2017    Consultants None  Imaging: CT abdomen pelvis w contrast 07/10/17: High-grade small bowel obstruction with a transition point in the mid abdomen as described likely related to postoperative Adhesions. Small gallstones without complicating factors. Chronic changes as described above without other acute abnormality.  Procedures Dr. Daphine DeutscherMartin (07/13/17) - Exploratory laparotomy with enterolysis and over-sewing of serosal tear  Hospital Course:  [name] is a 71yo female with h/o multiple previous abdominal surgeries who [facility] with over 1 week of progressive abdominal pain, abdominal distension, nausea, vomiting, and constipation. She was seen by PCP earlier that day who found SBO on xray; patient was advised to go to the ED. CT scan showed a high grade SBO with transition point in the mid abdomen likely due to adhesions. Patient was admitted for bowel rest, IVF, and monitoring.  Obstruction did not resolved therefore the patient was taken to the operating room on 7/22. She required an exploratory laparotomy with enterolysis and over-sewing of serosal tear. Tolerated procedure well and was transferred to the floor.  She did have an ileus postoperatively, but this improved with time. Diet was advanced as tolerated.  Electrolytes were replaced as needed. On POD7 the patient was voiding well, tolerating diet, ambulating well, pain well controlled, vital signs stable, incisions c/d/i and felt stable for discharge home.  Patient will follow up in our office in 2 weeks and knows  to call with questions or concerns.  She will call to confirm appointment date/time.    I was not directly involved in this patient's care at discharge, therefore the information in this discharge summary was taken from the chart.    Allergies as of 07/20/2017   No Known Allergies     Medication List    TAKE these medications   amLODipine 5 MG tablet Commonly known as:  NORVASC Take 2.5 mg by mouth daily.   bisacodyl 10 MG suppository Commonly known as:  DULCOLAX Place 10 mg rectally 2 (two) times daily as needed for moderate constipation.   cycloSPORINE 0.05 % ophthalmic emulsion Commonly known as:  RESTASIS Place 1 drop into both eyes 2 (two) times daily.   denosumab 60 MG/ML Soln injection Commonly known as:  PROLIA Inject 60 mg into the skin every 6 (six) months. Administer in upper arm, thigh, or abdomen   EYLEA 2 MG/0.05ML Soln Generic drug:  Aflibercept 2 mg by Intravitreal route See admin instructions. Pt goes approximately every two months.   HYDROcodone-acetaminophen 5-325 MG tablet Commonly known as:  NORCO/VICODIN Take 1-2 tablets by mouth every 4 (four) hours as needed for moderate pain or severe pain.   Magnesium 250 MG Tabs Take 250 mg by mouth daily.   metroNIDAZOLE 0.75 % gel Commonly known as:  METROGEL Apply 1 application topically 2 (two) times daily. Pt applies to face.   omeprazole 20 MG capsule Commonly known as:  PRILOSEC Take 20 mg by mouth daily as needed (for acid reflux).   ondansetron 8 MG tablet Commonly known as:  ZOFRAN Take 8 mg by mouth every 8 (eight) hours as needed for nausea or vomiting.   simethicone 80 MG chewable tablet  Commonly known as:  MYLICON Chew 80 mg by mouth every 6 (six) hours as needed for flatulence.   telmisartan 80 MG tablet Commonly known as:  MICARDIS Take 80 mg by mouth daily.   Vitamin D3 2000 units Tabs Take 2,000 Units by mouth daily.        Follow-up Information    Luretha MurphyMartin, Matthew, MD  Follow up in 2 week(s).   Specialty:  General Surgery Contact information: [address] [place] [address] [phone]           Signed: Franne FortsBROOKE A MEUTH, [facility] 07/22/2017, 2:48 PM Pager: [phone] Consults: [phone] Mon-Fri 7:00 am-4:30 pm Sat-Sun 7:00 am-11:30 am

## 2017-08-27 DIAGNOSIS — H35042 Retinal micro-aneurysms, unspecified, left eye: Secondary | ICD-10-CM | POA: Diagnosis not present

## 2017-08-27 DIAGNOSIS — H2512 Age-related nuclear cataract, left eye: Secondary | ICD-10-CM | POA: Diagnosis not present

## 2017-08-27 DIAGNOSIS — H2511 Age-related nuclear cataract, right eye: Secondary | ICD-10-CM | POA: Diagnosis not present

## 2017-08-27 DIAGNOSIS — H34832 Tributary (branch) retinal vein occlusion, left eye, with macular edema: Secondary | ICD-10-CM | POA: Diagnosis not present

## 2017-08-27 DIAGNOSIS — H35352 Cystoid macular degeneration, left eye: Secondary | ICD-10-CM | POA: Diagnosis not present

## 2017-09-04 DIAGNOSIS — D649 Anemia, unspecified: Secondary | ICD-10-CM | POA: Diagnosis not present

## 2017-09-04 DIAGNOSIS — M810 Age-related osteoporosis without current pathological fracture: Secondary | ICD-10-CM | POA: Diagnosis not present

## 2017-09-04 DIAGNOSIS — I10 Essential (primary) hypertension: Secondary | ICD-10-CM | POA: Diagnosis not present

## 2017-09-04 DIAGNOSIS — E7800 Pure hypercholesterolemia, unspecified: Secondary | ICD-10-CM | POA: Diagnosis not present

## 2017-09-09 DIAGNOSIS — D649 Anemia, unspecified: Secondary | ICD-10-CM | POA: Diagnosis not present

## 2017-09-09 DIAGNOSIS — I8390 Asymptomatic varicose veins of unspecified lower extremity: Secondary | ICD-10-CM | POA: Diagnosis not present

## 2017-09-09 DIAGNOSIS — R636 Underweight: Secondary | ICD-10-CM | POA: Diagnosis not present

## 2017-09-09 DIAGNOSIS — Z0000 Encounter for general adult medical examination without abnormal findings: Secondary | ICD-10-CM | POA: Diagnosis not present

## 2017-09-30 DIAGNOSIS — H34832 Tributary (branch) retinal vein occlusion, left eye, with macular edema: Secondary | ICD-10-CM | POA: Diagnosis not present

## 2017-09-30 DIAGNOSIS — H35042 Retinal micro-aneurysms, unspecified, left eye: Secondary | ICD-10-CM | POA: Diagnosis not present

## 2017-10-08 DIAGNOSIS — M810 Age-related osteoporosis without current pathological fracture: Secondary | ICD-10-CM | POA: Diagnosis not present

## 2017-10-20 DIAGNOSIS — H34832 Tributary (branch) retinal vein occlusion, left eye, with macular edema: Secondary | ICD-10-CM | POA: Diagnosis not present

## 2017-10-28 ENCOUNTER — Emergency Department (HOSPITAL_COMMUNITY): Payer: Medicare HMO

## 2017-10-28 ENCOUNTER — Inpatient Hospital Stay (HOSPITAL_COMMUNITY)
Admission: EM | Admit: 2017-10-28 | Discharge: 2017-10-31 | DRG: 389 | Disposition: A | Discharge status: Home / Self Care | Payer: Medicare HMO | Attending: Family Medicine | Admitting: Family Medicine

## 2017-10-28 ENCOUNTER — Other Ambulatory Visit: Payer: Self-pay

## 2017-10-28 ENCOUNTER — Encounter (HOSPITAL_COMMUNITY): Payer: Self-pay | Admitting: Emergency Medicine

## 2017-10-28 DIAGNOSIS — I10 Essential (primary) hypertension: Secondary | ICD-10-CM | POA: Diagnosis not present

## 2017-10-28 DIAGNOSIS — M810 Age-related osteoporosis without current pathological fracture: Secondary | ICD-10-CM | POA: Diagnosis present

## 2017-10-28 DIAGNOSIS — Z4659 Encounter for fitting and adjustment of other gastrointestinal appliance and device: Secondary | ICD-10-CM

## 2017-10-28 DIAGNOSIS — E876 Hypokalemia: Secondary | ICD-10-CM | POA: Diagnosis not present

## 2017-10-28 DIAGNOSIS — D72829 Elevated white blood cell count, unspecified: Secondary | ICD-10-CM | POA: Diagnosis present

## 2017-10-28 DIAGNOSIS — K219 Gastro-esophageal reflux disease without esophagitis: Secondary | ICD-10-CM | POA: Diagnosis present

## 2017-10-28 DIAGNOSIS — Z79899 Other long term (current) drug therapy: Secondary | ICD-10-CM | POA: Diagnosis not present

## 2017-10-28 DIAGNOSIS — Z4682 Encounter for fitting and adjustment of non-vascular catheter: Secondary | ICD-10-CM | POA: Diagnosis not present

## 2017-10-28 DIAGNOSIS — Q433 Congenital malformations of intestinal fixation: Secondary | ICD-10-CM | POA: Diagnosis not present

## 2017-10-28 DIAGNOSIS — K56609 Unspecified intestinal obstruction, unspecified as to partial versus complete obstruction: Secondary | ICD-10-CM

## 2017-10-28 DIAGNOSIS — K5669 Other partial intestinal obstruction: Secondary | ICD-10-CM | POA: Diagnosis not present

## 2017-10-28 DIAGNOSIS — K565 Intestinal adhesions [bands], unspecified as to partial versus complete obstruction: Secondary | ICD-10-CM | POA: Diagnosis present

## 2017-10-28 DIAGNOSIS — L723 Sebaceous cyst: Secondary | ICD-10-CM | POA: Diagnosis present

## 2017-10-28 DIAGNOSIS — R109 Unspecified abdominal pain: Secondary | ICD-10-CM | POA: Diagnosis not present

## 2017-10-28 LAB — LIPASE, BLOOD: LIPASE: 28 U/L (ref 11–51)

## 2017-10-28 LAB — COMPREHENSIVE METABOLIC PANEL
ALBUMIN: 5.1 g/dL — AB (ref 3.5–5.0)
ALK PHOS: 103 U/L (ref 38–126)
ALT: 44 U/L (ref 14–54)
ANION GAP: 13 (ref 5–15)
AST: 41 U/L (ref 15–41)
BUN: 17 mg/dL (ref 6–20)
CALCIUM: 10.4 mg/dL — AB (ref 8.9–10.3)
CHLORIDE: 90 mmol/L — AB (ref 101–111)
CO2: 32 mmol/L (ref 22–32)
Creatinine, Ser: 0.7 mg/dL (ref 0.44–1.00)
GFR calc non Af Amer: >60 mL/min (ref >60)
Glucose, Bld: 138 mg/dL — ABNORMAL HIGH (ref 65–99)
POTASSIUM: 3.9 mmol/L (ref 3.5–5.1)
SODIUM: 135 mmol/L (ref 135–145)
Total Bilirubin: 1 mg/dL (ref 0.3–1.2)
Total Protein: 8.7 g/dL — ABNORMAL HIGH (ref 6.5–8.1)

## 2017-10-28 LAB — URINALYSIS, ROUTINE W REFLEX MICROSCOPIC
BACTERIA UA: NONE SEEN
Bilirubin Urine: NEGATIVE
Glucose, UA: NEGATIVE mg/dL
Hgb urine dipstick: NEGATIVE
Ketones, ur: 20 mg/dL — AB
Leukocytes, UA: NEGATIVE
Nitrite: NEGATIVE
PH: 6 (ref 5.0–8.0)
Protein, ur: 100 mg/dL — AB
Specific Gravity, Urine: 1.019 (ref 1.005–1.030)

## 2017-10-28 LAB — CBC
HEMATOCRIT: 41.1 % (ref 36.0–46.0)
HEMOGLOBIN: 14.1 g/dL (ref 12.0–15.0)
MCH: 31.8 pg (ref 26.0–34.0)
MCHC: 34.3 g/dL (ref 30.0–36.0)
MCV: 92.6 fL (ref 78.0–100.0)
Platelets: 290 10*3/uL (ref 150–400)
RBC: 4.44 MIL/uL (ref 3.87–5.11)
RDW: 14.2 % (ref 11.5–15.5)
WBC: 13.1 10*3/uL — ABNORMAL HIGH (ref 4.0–10.5)

## 2017-10-28 MED ORDER — IOPAMIDOL (ISOVUE-300) INJECTION 61%: 100.0000 mL | Freq: Once | INTRAVENOUS | Status: DC | PRN

## 2017-10-28 MED ORDER — MORPHINE SULFATE (PF) 4 MG/ML IV SOLN
4.0000 mg | Freq: Once | INTRAVENOUS | Status: AC
  Administered 2017-10-28: 4 mg via INTRAVENOUS
  Filled 2017-10-28: qty 1

## 2017-10-28 MED ORDER — LIDOCAINE HCL 2 % EX GEL
CUTANEOUS | Status: AC
  Filled 2017-10-28: qty 11

## 2017-10-28 MED ORDER — METOCLOPRAMIDE HCL 5 MG/ML IJ SOLN
10.0000 mg | Freq: Once | INTRAMUSCULAR | Status: AC
  Administered 2017-10-28: 10 mg via INTRAVENOUS
  Filled 2017-10-28: qty 2

## 2017-10-28 MED ORDER — IOPAMIDOL (ISOVUE-300) INJECTION 61%
INTRAVENOUS | Status: AC
  Filled 2017-10-28: qty 100

## 2017-10-28 MED ORDER — CYCLOSPORINE 0.05 % OP EMUL
1.0000 [drp] | Freq: Two times a day (BID) | OPHTHALMIC | Status: DC
  Administered 2017-10-29 – 2017-10-31 (×4): 1 [drp] via OPHTHALMIC
  Filled 2017-10-28 (×6): qty 1

## 2017-10-28 MED ORDER — SODIUM CHLORIDE 0.9 % IV BOLUS (SEPSIS)
1000.0000 mL | Freq: Once | INTRAVENOUS | Status: AC
  Administered 2017-10-28: 1000 mL via INTRAVENOUS

## 2017-10-28 MED ORDER — ONDANSETRON HCL 4 MG PO TABS: 4.0000 mg | ORAL_TABLET | Freq: Four times a day (QID) | ORAL | Status: DC | PRN

## 2017-10-28 MED ORDER — SODIUM CHLORIDE 0.9 % IV SOLN
INTRAVENOUS | Status: DC
  Administered 2017-10-28: via INTRAVENOUS

## 2017-10-28 MED ORDER — ACETAMINOPHEN 325 MG PO TABS: 650.0000 mg | ORAL_TABLET | Freq: Four times a day (QID) | ORAL | Status: DC | PRN

## 2017-10-28 MED ORDER — METRONIDAZOLE 0.75 % EX GEL
1.0000 "application " | Freq: Two times a day (BID) | CUTANEOUS | Status: DC
  Administered 2017-10-29 – 2017-10-31 (×4): 1 via TOPICAL
  Filled 2017-10-28: qty 45

## 2017-10-28 MED ORDER — MORPHINE SULFATE (PF) 2 MG/ML IV SOLN: 1.0000 mg | INTRAVENOUS | Status: DC | PRN

## 2017-10-28 MED ORDER — ENOXAPARIN SODIUM 40 MG/0.4ML ~~LOC~~ SOLN
40.0000 mg | Freq: Every day | SUBCUTANEOUS | Status: DC
  Administered 2017-10-28 – 2017-10-30 (×3): 40 mg via SUBCUTANEOUS
  Filled 2017-10-28 (×3): qty 0.4

## 2017-10-28 MED ORDER — FAMOTIDINE IN NACL 20-0.9 MG/50ML-% IV SOLN
20.0000 mg | Freq: Two times a day (BID) | INTRAVENOUS | Status: DC
  Administered 2017-10-28 – 2017-10-29 (×3): 20 mg via INTRAVENOUS
  Filled 2017-10-28 (×4): qty 50

## 2017-10-28 MED ORDER — ACETAMINOPHEN 650 MG RE SUPP: 650.0000 mg | Freq: Four times a day (QID) | RECTAL | Status: DC | PRN

## 2017-10-28 MED ORDER — IOPAMIDOL (ISOVUE-300) INJECTION 61%
100.0000 mL | Freq: Once | INTRAVENOUS | Status: AC | PRN
  Administered 2017-10-28: 100 mL via INTRAVENOUS

## 2017-10-28 MED ORDER — DIATRIZOATE MEGLUMINE & SODIUM 66-10 % PO SOLN
90.0000 mL | Freq: Once | ORAL | Status: AC
  Administered 2017-10-28: 90 mL via NASOGASTRIC
  Filled 2017-10-28: qty 90

## 2017-10-28 MED ORDER — HYDRALAZINE HCL 20 MG/ML IJ SOLN: 10.0000 mg | INTRAMUSCULAR | Status: DC | PRN

## 2017-10-28 MED ORDER — ONDANSETRON HCL 4 MG/2ML IJ SOLN
4.0000 mg | Freq: Four times a day (QID) | INTRAMUSCULAR | Status: DC | PRN
  Administered 2017-10-29: 4 mg via INTRAVENOUS
  Filled 2017-10-28: qty 2

## 2017-10-28 MED ORDER — ONDANSETRON HCL 4 MG/2ML IJ SOLN
4.0000 mg | Freq: Once | INTRAMUSCULAR | Status: AC
  Administered 2017-10-28: 4 mg via INTRAVENOUS
  Filled 2017-10-28: qty 2

## 2017-10-28 NOTE — Consult Note (Signed)
Reason for Consult: Small bowel obstruction Referring Physician: Dr. Elvina Mattes [name] is an 71 y.o. female.  HPI: 71 year old female with a past medical history significant for previous bowel obstruction, malrotation, status post ex lap for small bowel obstruction in July 2018.  Patient states she has a history of abdominal pain, nausea vomiting.  She states the symptoms were similar to her previous bowel obstruction.  She does state that her last bowel movement was yesterday morning.  Secondary to continued abdominal distention and pain she presented to the ER.  Upon evaluation in the ER she underwent CT scan.  CT scan revealed small bowel obstruction with possible transition point in twist in the mesentery.  There appear to be no free air.  I did review the scans myself.  Surgery was consulted for further evaluation and management  Past Medical History:  Diagnosis Date  . Hypertension   . Macular edema   . Osteoporosis     Past Surgical History:  Procedure Laterality Date  . ABDOMINAL HYSTERECTOMY    . ABDOMINAL SURGERY    . APPENDECTOMY      Family History  Problem Relation Age of Onset  . Stroke Mother   . Hypertension Mother   . Osteoarthritis Mother     Social History:  reports that  has never smoked. she has never used smokeless tobacco. She reports that she drinks about 0.6 oz of alcohol per week. She reports that she does not use drugs.  Allergies: No Known Allergies  Medications: I have reviewed the patient's current medications.  Results for orders placed or performed during the hospital encounter of 10/28/17 (from the past 48 hour(s))  Lipase, blood     Status: None   Collection Time: 10/28/17  1:42 PM  Result Value Ref Range   Lipase 28 11 - 51 U/L  Comprehensive metabolic panel     Status: Abnormal   Collection Time: 10/28/17  1:42 PM  Result Value Ref Range   Sodium 135 135 - 145 mmol/L   Potassium 3.9 3.5 - 5.1 mmol/L   Chloride 90 (L) 101 - 111  mmol/L   CO2 32 22 - 32 mmol/L   Glucose, Bld 138 (H) 65 - 99 mg/dL   BUN 17 6 - 20 mg/dL   Creatinine, Ser 0.70 0.44 - 1.00 mg/dL   Calcium 10.4 (H) 8.9 - 10.3 mg/dL   Total Protein 8.7 (H) 6.5 - 8.1 g/dL   Albumin 5.1 (H) 3.5 - 5.0 g/dL   AST 41 15 - 41 U/L   ALT 44 14 - 54 U/L   Alkaline Phosphatase 103 38 - 126 U/L   Total Bilirubin 1.0 0.3 - 1.2 mg/dL   GFR calc non Af Amer >60 >60 mL/min   GFR calc Af Amer >60 >60 mL/min    Comment: (NOTE) The eGFR has been calculated using the CKD EPI equation. This calculation has not been validated in all clinical situations. eGFR's persistently <60 mL/min signify possible Chronic Kidney Disease.    Anion gap 13 5 - 15  CBC     Status: Abnormal   Collection Time: 10/28/17  1:42 PM  Result Value Ref Range   WBC 13.1 (H) 4.0 - 10.5 K/uL   RBC 4.44 3.87 - 5.11 MIL/uL   Hemoglobin 14.1 12.0 - 15.0 g/dL   HCT 41.1 36.0 - 46.0 %   MCV 92.6 78.0 - 100.0 fL   MCH 31.8 26.0 - 34.0 pg   MCHC 34.3 30.0 -  36.0 g/dL   RDW 14.2 11.5 - 15.5 %   Platelets 290 150 - 400 K/uL  Urinalysis, Routine w reflex microscopic     Status: Abnormal   Collection Time: 10/28/17  5:34 PM  Result Value Ref Range   Color, Urine YELLOW YELLOW   APPearance CLEAR CLEAR   Specific Gravity, Urine 1.019 1.005 - 1.030   pH 6.0 5.0 - 8.0   Glucose, UA NEGATIVE NEGATIVE mg/dL   Hgb urine dipstick NEGATIVE NEGATIVE   Bilirubin Urine NEGATIVE NEGATIVE   Ketones, ur 20 (A) NEGATIVE mg/dL   Protein, ur 100 (A) NEGATIVE mg/dL   Nitrite NEGATIVE NEGATIVE   Leukocytes, UA NEGATIVE NEGATIVE   RBC / HPF 6-30 0 - 5 RBC/hpf   WBC, UA 0-5 0 - 5 WBC/hpf   Bacteria, UA NONE SEEN NONE SEEN   Squamous Epithelial / LPF 0-5 (A) NONE SEEN   Mucus PRESENT     Ct Abdomen Pelvis W Contrast  Result Date: 10/28/2017 CLINICAL DATA:  Abdomen pain EXAM: CT ABDOMEN AND PELVIS WITH CONTRAST TECHNIQUE: Multidetector CT imaging of the abdomen and pelvis was performed using the standard  protocol following bolus administration of intravenous contrast. CONTRAST:  163m ISOVUE-300 IOPAMIDOL (ISOVUE-300) INJECTION 61% COMPARISON:  07/11/2017, CT 07/10/2017, 04/30/2008 FINDINGS: Lower chest: Lung bases demonstrate minimal bronchiectasis in the right lower lobe. No acute consolidation or effusion. Heart size within normal limits Hepatobiliary: Stable small cyst in the right hepatic lobe. No calcified gallstones or biliary dilatation. Pancreas: Unremarkable. No pancreatic ductal dilatation or surrounding inflammatory changes. Spleen: Normal in size without focal abnormality. Adrenals/Urinary Tract: Adrenal glands are within normal limits. subcentimeter hypodense lesions within both kidneys too small to further characterize, probable cysts. Bladder within normal limits. Stomach/Bowel: Fluid-filled moderately enlarged stomach. Abnormal position of the duodenum jejunal junction which is seen to the right side of the spine. Multiple enlarged fluid-filled loops of proximal small bowel on the right side of the abdomen with transition white within the upper pelvis, slightly to the right of midline. Slight swirling configuration of the vessels in the region. Malrotated appearance of the bowel with small bowel on the right and colon on the left. The distal small bowel is decompressed. There is no colon wall thickening. There is diverticular disease of the colon. Vascular/Lymphatic: Moderate atherosclerotic vascular calcification. No aneurysmal dilatation. No abnormal adenopathy Reproductive: Status post hysterectomy. No adnexal masses. Other: Negative for free air. Small amount of free fluid in the pelvis. Musculoskeletal: No acute or suspicious bone lesion IMPRESSION: 1. Re- demonstrated malrotated configuration of the bowel. Moderate fluid-filled enlarged stomach with multiple loops of enlarged fluid-filled proximal small bowel on the right side of the abdomen with transition point visualized in the upper pelvis  slightly to the right of midline ; small bowel distal to this as well as the colon are decompressed. Findings are consistent with high-grade small bowel obstruction, this could be secondary to adhesions given prior surgical history, however slight swirling appearance of the vasculature near the transition point raises possibility of obstruction secondary to a twist in the mesentery. 2. Small amount of free fluid in the pelvis 3. Subcentimeter hypodense lesions in the kidneys too small to further characterize but probably cysts. Electronically Signed   By: KDonavan FoilM.D.   On: 10/28/2017 18:24   Dg Abd Portable 1v  Result Date: 10/28/2017 CLINICAL DATA:  71y/o  F; enteric tube placement. EXAM: PORTABLE ABDOMEN - 1 VIEW COMPARISON:  10/28/2017 abdomen CT FINDINGS: Enteric tube  tip projects over gastric body. Contrast is present within the renal collecting systems. Dilated small bowel in right hemiabdomen as seen on CT. Bones are unremarkable. IMPRESSION: Enteric tube tip projects over gastric body. Dilated loops of small bowel in right hemiabdomen. Electronically Signed   By: Kristine Garbe M.D.   On: 10/28/2017 19:18    Review of Systems  Constitutional: Negative for chills, fever and malaise/fatigue.  HENT: Negative for ear discharge, hearing loss and sore throat.   Eyes: Negative for blurred vision and discharge.  Respiratory: Negative for cough and shortness of breath.   Cardiovascular: Negative for chest pain, orthopnea and leg swelling.  Gastrointestinal: Positive for abdominal pain, nausea and vomiting. Negative for constipation, diarrhea and heartburn.  Musculoskeletal: Negative for myalgias and neck pain.  Skin: Negative for itching and rash.  Neurological: Negative for dizziness, focal weakness, seizures and loss of consciousness.  Endo/Heme/Allergies: Negative for environmental allergies. Does not bruise/bleed easily.  Psychiatric/Behavioral: Negative for depression and  suicidal ideas.  All other systems reviewed and are negative.  Blood pressure (!) 146/83, pulse 90, temperature 98.1 F (36.7 C), temperature source Oral, resp. rate 16, SpO2 96 %. Physical Exam  Constitutional: She is oriented to person, place, and time. Vital signs are normal. She appears well-developed and well-nourished.  Conversant No acute distress  HENT:  Head: Normocephalic and atraumatic.  Eyes: Conjunctivae and lids are normal. Pupils are equal, round, and reactive to light. No scleral icterus.  No lid lag Moist conjunctiva  Neck: Neck supple. No tracheal tenderness present. No thyromegaly present.  No cervical lymphadenopathy  Cardiovascular: Normal rate, regular rhythm and intact distal pulses.  No murmur heard. Respiratory: Effort normal and breath sounds normal. She has no wheezes. She has no rales.  GI: Soft. Bowel sounds are normal. She exhibits distension. There is no hepatosplenomegaly. There is tenderness (min ttp RLQ). There is no rebound and no guarding. No hernia.  Neurological: She is alert and oriented to person, place, and time.  Normal gait and station  Skin: Skin is warm. No rash noted. No cyanosis. Nails show no clubbing.  Normal skin turgor  Psychiatric: Judgment normal.  Appropriate affect    Assessment/Plan: 71 y/o F with SBO Malrotation H/o SBO  1.  Patient will be admitted, n.p.o., IV fluids, NGT to LIWS 2.  We will proceed with small bowel obstruction protocol.  I did discuss with the patient and her husband that if this is able to resolve on its own she could potentially avoid surgery.  If things do not progress over the next 24-48 hours she may require surgery for bowel obstruction.  Rosario Jacks., [name] 10/28/2017, 8:02 PM

## 2017-10-28 NOTE — ED Provider Notes (Signed)
[facility]-EMERGENCY DEPT Provider Note   CSN: [id number] Arrival date & time: 10/28/17  1258     History   Chief Complaint Chief Complaint  Patient presents with  . Abdominal Pain    HPI [name] is a 71 y.o. female.  HPI  71 y.o. female with a hx of HTN. SBO, presents to the Emergency Department today due to abdominal pain as well as N/V. Notes hx same. States pain in lower abdomen with similar symptoms from SBO in past. DCed from hospital on 07-20-17 due to SBO after admission from ED on 07-10-17. CT scan at that time showed a high grade SBO with transition point in the mid abdomen likely due to adhesions. Patient was admitted for bowel rest, IVF, and monitoring.  Obstruction did not resolved therefore the patient was taken to the operating room on 7/22. She required an exploratory laparotomy with enterolysis and over-sewing of serosal tear.Notes mild distention in abdomen. Rates pain 6/10 and tightness sensation. Unable to tolerate PO. Denies fevers. No CP/SOB. No headaches. No numbness/tingling. No other symptoms noted.   Past Medical History:  Diagnosis Date  . Hypertension   . Macular edema   . Osteoporosis     Patient Active Problem List   Diagnosis Date Noted  . Status post exploratory laparotomy 2018 07/13/2017  . SBO (small bowel obstruction), recurrent, s/p adhesiolysis 07/13/2017 07/10/2017    Past Surgical History:  Procedure Laterality Date  . ABDOMINAL HYSTERECTOMY    . ABDOMINAL SURGERY    . APPENDECTOMY      OB History    No data available       Home Medications    Prior to Admission medications   Medication Sig Start Date End Date Taking? Authorizing Provider  Aflibercept (EYLEA) 2 MG/0.05ML SOLN 2 mg by Intravitreal route See admin instructions. Pt goes approximately every two months.    [provider]  amLODipine (NORVASC) 5 MG tablet Take 2.5 mg by mouth daily.    [provider]  bisacodyl  (DULCOLAX) 10 MG suppository Place 10 mg rectally 2 (two) times daily as needed for moderate constipation.     [provider]  Cholecalciferol (VITAMIN D3) 2000 units TABS Take 2,000 Units by mouth daily.    [provider]  cycloSPORINE (RESTASIS) 0.05 % ophthalmic emulsion Place 1 drop into both eyes 2 (two) times daily.    [provider]  denosumab (PROLIA) 60 MG/ML SOLN injection Inject 60 mg into the skin every 6 (six) months. Administer in upper arm, thigh, or abdomen    [provider]  HYDROcodone-acetaminophen (NORCO/VICODIN) 5-325 MG tablet Take 1-2 tablets by mouth every 4 (four) hours as needed for moderate pain or severe pain. 07/20/17   Chevis Pretty III, MD  Magnesium 250 MG TABS Take 250 mg by mouth daily.    [provider]  metroNIDAZOLE (METROGEL) 0.75 % gel Apply 1 application topically 2 (two) times daily. Pt applies to face.    [provider]  omeprazole (PRILOSEC) 20 MG capsule Take 20 mg by mouth daily as needed (for acid reflux).    [provider]  ondansetron (ZOFRAN) 8 MG tablet Take 8 mg by mouth every 8 (eight) hours as needed for nausea or vomiting.    [provider]  simethicone (MYLICON) 80 MG chewable tablet Chew 80 mg by mouth every 6 (six) hours as needed for flatulence.    [provider]  telmisartan (MICARDIS) 80 MG tablet Take  80 mg by mouth daily.    [provider]    Family History Family History  Problem Relation Age of Onset  . Stroke Mother   . Hypertension Mother   . Osteoarthritis Mother     Social History Social History   Tobacco Use  . Smoking status: Never Smoker  . Smokeless tobacco: Never Used  Substance Use Topics  . Alcohol use: Yes    Alcohol/week: 0.6 oz    Types: 1 Glasses of wine per week    Comment: daily  . Drug use: No     Allergies   Patient has no known allergies.   Review of Systems Review of Systems ROS reviewed and all  are negative for acute change except as noted in the HPI.  Physical Exam Updated Vital Signs BP (!) 154/97 (BP Location: Left Arm)   Pulse 99   Temp 98.1 F (36.7 C) (Oral)   Resp 16   SpO2 99%   Physical Exam  Constitutional: She is oriented to person, place, and time. She appears well-developed and well-nourished. No distress.  HENT:  Head: Normocephalic and atraumatic.  Right Ear: Tympanic membrane, external ear and ear canal normal.  Left Ear: Tympanic membrane, external ear and ear canal normal.  Nose: Nose normal.  Mouth/Throat: Uvula is midline, oropharynx is clear and moist and mucous membranes are normal. No trismus in the jaw. No oropharyngeal exudate, posterior oropharyngeal erythema or tonsillar abscesses.  Eyes: EOM are normal. Pupils are equal, round, and reactive to light.  Neck: Normal range of motion. Neck supple. No tracheal deviation present.  Cardiovascular: Normal rate, regular rhythm, S1 normal, S2 normal, normal heart sounds, intact distal pulses and normal pulses.  Pulmonary/Chest: Effort normal and breath sounds normal. No respiratory distress. She has no decreased breath sounds. She has no wheezes. She has no rhonchi. She has no rales.  Abdominal: Normal appearance and bowel sounds are normal. There is generalized tenderness. There is no rigidity, no rebound, no guarding, no CVA tenderness, no tenderness at McBurney's point and negative Murphy's sign.  Mild distention. Abdomen soft  Musculoskeletal: Normal range of motion.  Neurological: She is alert and oriented to person, place, and time.  Skin: Skin is warm and dry.  Psychiatric: She has a normal mood and affect. Her speech is normal and behavior is normal. Thought content normal.  Nursing note and vitals reviewed.    ED Treatments / Results  Labs (all labs ordered are listed, but only abnormal results are displayed) Labs Reviewed  COMPREHENSIVE METABOLIC PANEL - Abnormal; Notable for the following  components:      Result Value   Chloride 90 (*)    Glucose, Bld 138 (*)    Calcium 10.4 (*)    Total Protein 8.7 (*)    Albumin 5.1 (*)    All other components within normal limits  CBC - Abnormal; Notable for the following components:   WBC 13.1 (*)    All other components within normal limits  URINALYSIS, ROUTINE W REFLEX MICROSCOPIC - Abnormal; Notable for the following components:   Ketones, ur 20 (*)    Protein, ur 100 (*)    Squamous Epithelial / LPF 0-5 (*)    All other components within normal limits  LIPASE, BLOOD    EKG  EKG Interpretation None       Radiology Ct Abdomen Pelvis W Contrast  Result Date: 10/28/2017 CLINICAL DATA:  Abdomen pain EXAM: CT ABDOMEN AND PELVIS WITH CONTRAST TECHNIQUE: Multidetector  CT imaging of the abdomen and pelvis was performed using the standard protocol following bolus administration of intravenous contrast. CONTRAST:  ISOVUE-300 IOPAMIDOL (ISOVUE-300) INJECTION 61% COMPARISON:  07/11/2017, CT 07/10/2017, 04/30/2008 FINDINGS: Lower chest: Lung bases demonstrate minimal bronchiectasis in the right lower lobe. No acute consolidation or effusion. Heart size within normal limits Hepatobiliary: Stable small cyst in the right hepatic lobe. No calcified gallstones or biliary dilatation. Pancreas: Unremarkable. No pancreatic ductal dilatation or surrounding inflammatory changes. Spleen: Normal in size without focal abnormality. Adrenals/Urinary Tract: Adrenal glands are within normal limits. subcentimeter hypodense lesions within both kidneys too small to further characterize, probable cysts. Bladder within normal limits. Stomach/Bowel: Fluid-filled moderately enlarged stomach. Abnormal position of the duodenum jejunal junction which is seen to the right side of the spine. Multiple enlarged fluid-filled loops of proximal small bowel on the right side of the abdomen with transition white within the upper pelvis, slightly to the right of midline.  Slight swirling configuration of the vessels in the region. Malrotated appearance of the bowel with small bowel on the right and colon on the left. The distal small bowel is decompressed. There is no colon wall thickening. There is diverticular disease of the colon. Vascular/Lymphatic: Moderate atherosclerotic vascular calcification. No aneurysmal dilatation. No abnormal adenopathy Reproductive: Status post hysterectomy. No adnexal masses. Other: Negative for free air. Small amount of free fluid in the pelvis. Musculoskeletal: No acute or suspicious bone lesion IMPRESSION: 1. Re- demonstrated malrotated configuration of the bowel. Moderate fluid-filled enlarged stomach with multiple loops of enlarged fluid-filled proximal small bowel on the right side of the abdomen with transition point visualized in the upper pelvis slightly to the right of midline ; small bowel distal to this as well as the colon are decompressed. Findings are consistent with high-grade small bowel obstruction, this could be secondary to adhesions given prior surgical history, however slight swirling appearance of the vasculature near the transition point raises possibility of obstruction secondary to a twist in the mesentery. 2. Small amount of free fluid in the pelvis 3. Subcentimeter hypodense lesions in the kidneys too small to further characterize but probably cysts. Electronically Signed   By: Jasmine Pang M.D.   On: 10/28/2017 18:24    Procedures Procedures (including critical care time)  Medications Ordered in ED Medications  iopamidol (ISOVUE-300) 61 % injection 100 mL (not administered)  iopamidol (ISOVUE-300) 61 % injection (not administered)  lidocaine (XYLOCAINE) 2 % jelly (not administered)  sodium chloride 0.9 % bolus 1,000 mL (0 mLs Intravenous Stopped 10/28/17 1720)  ondansetron (ZOFRAN) injection 4 mg (4 mg Intravenous Given 10/28/17 1508)  morphine 4 MG/ML injection 4 mg (4 mg Intravenous Given 10/28/17 1508)    iopamidol (ISOVUE-300) 61 % injection 100 mL (100 mLs Intravenous Contrast Given 10/28/17 1737)  metoCLOPramide (REGLAN) injection 10 mg (10 mg Intravenous Given 10/28/17 1818)     Initial Impression / Assessment and Plan / ED Course  I have reviewed the triage vital signs and the nursing notes.  Pertinent labs & imaging results that were available during my care of the patient were reviewed by me and considered in my medical decision making (see chart for details).  Final Clinical Impressions(s) / ED Diagnoses  {I have reviewed and evaluated the relevant laboratory values. {I have reviewed and evaluated the relevant imaging studies.  {I have reviewed the relevant previous healthcare records.  {I obtained HPI from historian.   ED Course:  Assessment: Pt is a 71 y.o. female with a hx  of HTN. SBO, presents to the Emergency Department today due to abdominal pain as well as N/V. Notes hx same. States pain in lower abdomen with similar symptoms from SBO in past. DCed from hospital on 07-20-17 due to SBO after admission from ED on 07-10-17. CT scan at that time showed a high grade SBO with transition point in the mid abdomen likely due to adhesions. Patient was admitted for bowel rest, IVF, and monitoring.  Obstruction did not resolved therefore the patient was taken to the operating room on 7/22. She required an exploratory laparotomy with enterolysis and over-sewing of serosal tear.Notes mild distention in abdomen. Rates pain 6/10 and tightness sensation. Unable to tolerate PO. Denies fevers. No CP/SOB. No headaches. No numbness/tingling. On exam, pt in NAD. Nontoxic/nonseptic appearing. VSS. Afebrile. Lungs CTA. Heart RRR. Abdomen generally tender. Soft. Mild distention. Labs unremarkable. Mild eukocytosis. Concern for SBO due to extensive hx. CT Abdomen/Pelvis shows high grade SBO. Consult to General Surgery (Dr. Derrell Lollingamirez). Given fluids and analgesia in ED. Plan is to Admit.   Disposition/Plan:   Admit Pt acknowledges and agrees with plan  Supervising Physician Azalia Bilisampos, Kevin, MD  Final diagnoses:  Small bowel obstruction [facility](HCC)    ED Discharge Orders    None       Audry PiliMohr, Shene Maxfield, PA-C 10/28/17 1845    Azalia Bilisampos, Kevin, MD 10/29/17 2105

## 2017-10-28 NOTE — H&P (Signed)
History and Physical    Analyah Dorsey ZOX:[id number] DOB: April 11, 1946 DOA: 10/28/2017  PCP: Merri Brunette, MD   Patient coming from: Home  Chief Complaint: Abdominal pain and distention, nausea/vomiting  HPI: [name] is a 71 y.o. female with medical history significant for hypertension, osteoporosis, laparotomy with lysis of adhesions in July 2018, now presenting to the emergency department for evaluation pain, distention, vomiting.  Reports that she had been in her usual state of health with a bowel movement yesterday, but has since developed nausea with vomiting and progressive, severe, sharp, localized pain across the lower abdomen associated with distention.  She describes the symptoms as very similar to those that she experienced with her prior SBO.  Denies fevers or chills.  Denies chest pain or palpitations.  No cough or dyspnea.  ED Course: Upon arrival to the ED, patient is found to be afebrile, saturating well on room air, was stable.  Chemistry panel features elevations in serum albumin and protein CBC is notable for leukocytosis to 13,100.  Urinalysis features ketones.  CT of the abdomen and pelvis is consistent with high-grade SBO with transition point in the upper pelvis just right of the midline.  Patient was given a liter of normal saline, morphine, Zofran, and right coronary emergency department NG tube was placed.  General surgery was consulted by ED physician and has evaluated patient emergency department.  She will be admitted to the medical-surgical unit for ongoing evaluation and management of recurrent SBO.  Review of Systems:  All other systems reviewed and apart from HPI, are negative.  Past Medical History:  Diagnosis Date  . Hypertension   . Macular edema   . Osteoporosis     Past Surgical History:  Procedure Laterality Date  . ABDOMINAL HYSTERECTOMY    . ABDOMINAL SURGERY    . APPENDECTOMY       reports that  has never smoked. she has never used  smokeless tobacco. She reports that she drinks about 0.6 oz of alcohol per week. She reports that she does not use drugs.  No Known Allergies  Family History  Problem Relation Age of Onset  . Stroke Mother   . Hypertension Mother   . Osteoarthritis Mother      Prior to Admission medications   Medication Sig Start Date End Date Taking? Authorizing Provider  Aflibercept (EYLEA) 2 MG/0.05ML SOLN 2 mg by Intravitreal route See admin instructions. Pt goes approximately every two months.   Yes [provider]  amLODipine (NORVASC) 5 MG tablet Take 2.5 mg by mouth daily.   Yes [provider]  cetirizine (ZYRTEC) 10 MG tablet Take 10 mg daily by mouth.   Yes [provider]  Cholecalciferol (VITAMIN D3) 2000 units TABS Take 2,000 Units by mouth daily.   Yes [provider]  cycloSPORINE (RESTASIS) 0.05 % ophthalmic emulsion Place 1 drop into both eyes 2 (two) times daily.   Yes [provider]  denosumab (PROLIA) 60 MG/ML SOLN injection Inject 60 mg into the skin every 6 (six) months. Administer in upper arm, thigh, or abdomen   Yes [provider]  metroNIDAZOLE (METROGEL) 0.75 % gel Apply 1 application topically 2 (two) times daily. Pt applies to face.   Yes [provider]  Multiple Vitamins-Calcium (ONE-A-DAY WOMENS PO) Take 1 tablet daily by mouth.   Yes [provider]  omeprazole (PRILOSEC) 20 MG capsule Take 20 mg by mouth daily as needed (for acid reflux).   Yes [provider]  ondansetron (ZOFRAN) 8 MG tablet Take 8 mg by mouth every 8 (eight) hours as needed for nausea or vomiting.   Yes [provider]  rOPINIRole (REQUIP) 0.5 MG tablet Take 0.25 mg at bedtime by mouth. 09/09/17  Yes [provider]  simethicone (MYLICON) 80 MG chewable tablet Chew 80 mg by mouth every 6 (six) hours as needed for flatulence.   Yes [provider]  telmisartan (MICARDIS) 80 MG tablet Take 80 mg by  mouth daily.   Yes [provider]  vitamin C (ASCORBIC ACID) 500 MG tablet Take 500 mg daily by mouth.   Yes [provider]  HYDROcodone-acetaminophen (NORCO/VICODIN) 5-325 MG tablet Take 1-2 tablets by mouth every 4 (four) hours as needed for moderate pain or severe pain. Patient not taking: Reported on 10/28/2017 07/20/17   Griselda Miner, MD    Physical Exam: Vitals:   10/28/17 1558 10/28/17 1900 10/28/17 1909 10/28/17 1945  BP: (!) 156/88 (!) 154/88 (!) 154/88 (!) 146/83  Pulse: 92 97 92 90  Resp: 16  16   Temp:      TempSrc:      SpO2: 99% 96% 97% 96%      Constitutional: NAD, calm, frail, in apparent discomfort Eyes: PERTLA, lids and conjunctivae normal ENMT: Mucous membranes are moist. Posterior pharynx clear of any exudate or lesions.   Neck: normal, supple, no masses, no thyromegaly Respiratory: clear to auscultation bilaterally, no wheezing, no crackles. Normal respiratory effort.    Cardiovascular: S1 & S2 heard, regular rate and rhythm. No extremity edema. No significant JVD. Abdomen: Mild distension. Generalized tenderness, worse in lower and mid-abdomen, no rebound pain or guardin.   Musculoskeletal: no clubbing / cyanosis. No joint deformity upper and lower extremities.    Skin: no significant rashes, lesions, ulcers. Poor turgor. Neurologic: CN 2-12 grossly intact. Sensation intact. Strength 5/5 in all 4 limbs.  Psychiatric:  Alert and oriented x 3. Calm, cooperative.     Labs on Admission: I have personally reviewed following labs and imaging studies  CBC: Recent Labs  Lab 10/28/17 1342  WBC 13.1*  HGB 14.1  HCT 41.1  MCV 92.6  PLT 290   Basic Metabolic Panel: Recent Labs  Lab 10/28/17 1342  NA 135  K 3.9  CL 90*  CO2 32  GLUCOSE 138*  BUN 17  CREATININE 0.70  CALCIUM 10.4*   GFR: CrCl cannot be calculated (Unknown ideal weight.). Liver Function Tests: Recent Labs  Lab 10/28/17 1342  AST 41  ALT 44  ALKPHOS 103    BILITOT 1.0  PROT 8.7*  ALBUMIN 5.1*   Recent Labs  Lab 10/28/17 1342  LIPASE 28   No results for input(s): AMMONIA in the last 168 hours. Coagulation Profile: No results for input(s): INR, PROTIME in the last 168 hours. Cardiac Enzymes: No results for input(s): CKTOTAL, CKMB, CKMBINDEX, TROPONINI in the last 168 hours. BNP (last 3 results) No results for input(s): PROBNP in the last 8760 hours. HbA1C: No results for input(s): HGBA1C in the last 72 hours. CBG: No results for input(s): GLUCAP in the last 168 hours. Lipid Profile: No results for input(s): CHOL, HDL, LDLCALC, TRIG, CHOLHDL, LDLDIRECT in the last 72 hours. Thyroid Function Tests: No results for input(s): TSH, T4TOTAL, FREET4, T3FREE, THYROIDAB in the last 72 hours. Anemia Panel: No results for input(s): VITAMINB12, FOLATE, FERRITIN, TIBC, IRON, RETICCTPCT in the last 72 hours. Urine analysis:    Component Value Date/Time   COLORURINE YELLOW 10/28/2017 1734  APPEARANCEUR CLEAR 10/28/2017 1734   LABSPEC 1.019 10/28/2017 1734   PHURINE 6.0 10/28/2017 1734   GLUCOSEU NEGATIVE 10/28/2017 1734   HGBUR NEGATIVE 10/28/2017 1734   BILIRUBINUR NEGATIVE 10/28/2017 1734   KETONESUR 20 (A) 10/28/2017 1734   PROTEINUR 100 (A) 10/28/2017 1734   UROBILINOGEN 0.2 10/18/2008 0626   NITRITE NEGATIVE 10/28/2017 1734   LEUKOCYTESUR NEGATIVE 10/28/2017 1734   Sepsis Labs: @LABRCNTIP (procalcitonin:4,lacticidven:4) )No results found for this or any previous visit (from the past 240 hour(s)).   Radiological Exams on Admission: Ct Abdomen Pelvis W Contrast  Result Date: 10/28/2017 CLINICAL DATA:  Abdomen pain EXAM: CT ABDOMEN AND PELVIS WITH CONTRAST TECHNIQUE: Multidetector CT imaging of the abdomen and pelvis was performed using the standard protocol following bolus administration of intravenous contrast. CONTRAST:  100mL ISOVUE-300 IOPAMIDOL (ISOVUE-300) INJECTION 61% COMPARISON:  07/11/2017, CT 07/10/2017, 04/30/2008  FINDINGS: Lower chest: Lung bases demonstrate minimal bronchiectasis in the right lower lobe. No acute consolidation or effusion. Heart size within normal limits Hepatobiliary: Stable small cyst in the right hepatic lobe. No calcified gallstones or biliary dilatation. Pancreas: Unremarkable. No pancreatic ductal dilatation or surrounding inflammatory changes. Spleen: Normal in size without focal abnormality. Adrenals/Urinary Tract: Adrenal glands are within normal limits. subcentimeter hypodense lesions within both kidneys too small to further characterize, probable cysts. Bladder within normal limits. Stomach/Bowel: Fluid-filled moderately enlarged stomach. Abnormal position of the duodenum jejunal junction which is seen to the right side of the spine. Multiple enlarged fluid-filled loops of proximal small bowel on the right side of the abdomen with transition white within the upper pelvis, slightly to the right of midline. Slight swirling configuration of the vessels in the region. Malrotated appearance of the bowel with small bowel on the right and colon on the left. The distal small bowel is decompressed. There is no colon wall thickening. There is diverticular disease of the colon. Vascular/Lymphatic: Moderate atherosclerotic vascular calcification. No aneurysmal dilatation. No abnormal adenopathy Reproductive: Status post hysterectomy. No adnexal masses. Other: Negative for free air. Small amount of free fluid in the pelvis. Musculoskeletal: No acute or suspicious bone lesion IMPRESSION: 1. Re- demonstrated malrotated configuration of the bowel. Moderate fluid-filled enlarged stomach with multiple loops of enlarged fluid-filled proximal small bowel on the right side of the abdomen with transition point visualized in the upper pelvis slightly to the right of midline ; small bowel distal to this as well as the colon are decompressed. Findings are consistent with high-grade small bowel obstruction, this could be  secondary to adhesions given prior surgical history, however slight swirling appearance of the vasculature near the transition point raises possibility of obstruction secondary to a twist in the mesentery. 2. Small amount of free fluid in the pelvis 3. Subcentimeter hypodense lesions in the kidneys too small to further characterize but probably cysts. Electronically Signed   By: Jasmine PangKim  Fujinaga M.D.   On: 10/28/2017 18:24   Dg Abd Portable 1v  Result Date: 10/28/2017 CLINICAL DATA:  71 y/o  F; enteric tube placement. EXAM: PORTABLE ABDOMEN - 1 VIEW COMPARISON:  10/28/2017 abdomen CT FINDINGS: Enteric tube tip projects over gastric body. Contrast is present within the renal collecting systems. Dilated small bowel in right hemiabdomen as seen on CT. Bones are unremarkable. IMPRESSION: Enteric tube tip projects over gastric body. Dilated loops of small bowel in right hemiabdomen. Electronically Signed   By: Mitzi HansenLance  Furusawa-Stratton M.D.   On: 10/28/2017 19:18    EKG: Not performed.   Assessment/Plan  1. SBO - Pt presents  with abdominal pain, distension, and N/V similar to prior experiences with SBO  - CT confirms SBO, high-grade, with transition point in upper pelvis just right of midline  - She required laparotomy with lysis of adhesions in July 2018  - Surgery is consulting and much appreciated  - NGT is in place, continue bowel-rest, IVF hydration, prn analgesia and antiemetics, serial exams   2. Hypertension  - BP at goal  - Norvasc and telmisartan held while NPO  - Use hydralazine IVP's prn for now     DVT prophylaxis: Lovenox Code Status: Full  Family Communication: Discussed with patient Disposition Plan: Admit to med-surg Consults called: Surgery Admission status: Inpatient    Briscoe Deutscherimothy S Jizelle Conkey, MD [facility] Pager [phone]  If 7PM-7AM, please contact night-coverage [email or web address] Password [email or web address]  10/28/2017, 8:20 PM

## 2017-10-28 NOTE — ED Triage Notes (Signed)
Pt complaint of abd pain with n/v; BM yesterday; hx of bowel obstruction.

## 2017-10-28 NOTE — ED Notes (Signed)
PT HAD DRAWN FOR LABS:  RED GOLD LAVENDER BLUE LT GREEN  DARK GREEN X2

## 2017-10-29 ENCOUNTER — Other Ambulatory Visit: Payer: Self-pay

## 2017-10-29 ENCOUNTER — Encounter (HOSPITAL_COMMUNITY): Payer: Self-pay | Admitting: Surgery

## 2017-10-29 ENCOUNTER — Inpatient Hospital Stay (HOSPITAL_COMMUNITY): Payer: Medicare HMO

## 2017-10-29 DIAGNOSIS — E876 Hypokalemia: Secondary | ICD-10-CM

## 2017-10-29 DIAGNOSIS — Q433 Congenital malformations of intestinal fixation: Secondary | ICD-10-CM

## 2017-10-29 LAB — CBC WITH DIFFERENTIAL/PLATELET
BASOS ABS: 0 10*3/uL (ref 0.0–0.1)
Basophils Relative: 0 %
EOS ABS: 0 10*3/uL (ref 0.0–0.7)
EOS PCT: 0 %
HCT: 38.3 % (ref 36.0–46.0)
Hemoglobin: 13 g/dL (ref 12.0–15.0)
LYMPHS ABS: 1.2 10*3/uL (ref 0.7–4.0)
LYMPHS PCT: 13 %
MCH: 31.6 pg (ref 26.0–34.0)
MCHC: 33.9 g/dL (ref 30.0–36.0)
MCV: 93.2 fL (ref 78.0–100.0)
MONO ABS: 0.9 10*3/uL (ref 0.1–1.0)
Monocytes Relative: 10 %
Neutro Abs: 7.2 10*3/uL (ref 1.7–7.7)
Neutrophils Relative %: 77 %
PLATELETS: 256 10*3/uL (ref 150–400)
RBC: 4.11 MIL/uL (ref 3.87–5.11)
RDW: 14.5 % (ref 11.5–15.5)
WBC: 9.4 10*3/uL (ref 4.0–10.5)

## 2017-10-29 LAB — BASIC METABOLIC PANEL
Anion gap: 10 (ref 5–15)
BUN: 19 mg/dL (ref 6–20)
CO2: 30 mmol/L (ref 22–32)
CREATININE: 0.64 mg/dL (ref 0.44–1.00)
Calcium: 9 mg/dL (ref 8.9–10.3)
Chloride: 98 mmol/L — ABNORMAL LOW (ref 101–111)
GFR calc Af Amer: >60 mL/min (ref >60)
GLUCOSE: 104 mg/dL — AB (ref 65–99)
POTASSIUM: 3.4 mmol/L — AB (ref 3.5–5.1)
SODIUM: 138 mmol/L (ref 135–145)

## 2017-10-29 LAB — GLUCOSE, CAPILLARY: Glucose-Capillary: 108 mg/dL — ABNORMAL HIGH (ref 65–99)

## 2017-10-29 MED ORDER — METOCLOPRAMIDE HCL 5 MG/ML IJ SOLN
10.0000 mg | Freq: Four times a day (QID) | INTRAMUSCULAR | Status: DC | PRN
  Administered 2017-10-29: 10 mg via INTRAVENOUS
  Filled 2017-10-29: qty 2

## 2017-10-29 MED ORDER — POTASSIUM CHLORIDE 10 MEQ/100ML IV SOLN
10.0000 meq | INTRAVENOUS | Status: AC
  Administered 2017-10-29 (×3): 10 meq via INTRAVENOUS
  Filled 2017-10-29 (×3): qty 100

## 2017-10-29 MED ORDER — DIATRIZOATE MEGLUMINE & SODIUM 66-10 % PO SOLN
90.0000 mL | Freq: Once | ORAL | Status: AC
  Administered 2017-10-29: 90 mL via NASOGASTRIC
  Filled 2017-10-29: qty 90

## 2017-10-29 MED ORDER — PANTOPRAZOLE SODIUM 40 MG IV SOLR
40.0000 mg | Freq: Every day | INTRAVENOUS | Status: DC
  Administered 2017-10-29: 40 mg via INTRAVENOUS
  Filled 2017-10-29: qty 40

## 2017-10-29 MED ORDER — PHENOL 1.4 % MT LIQD
1.0000 | OROMUCOSAL | Status: DC | PRN
  Administered 2017-10-29: 1 via OROMUCOSAL
  Filled 2017-10-29 (×2): qty 177

## 2017-10-29 MED ORDER — KCL IN DEXTROSE-NACL 20-5-0.9 MEQ/L-%-% IV SOLN
INTRAVENOUS | Status: DC
  Administered 2017-10-29 – 2017-10-30 (×3): via INTRAVENOUS
  Filled 2017-10-29 (×5): qty 1000

## 2017-10-29 MED ORDER — DIATRIZOATE MEGLUMINE & SODIUM 66-10 % PO SOLN: 90.0000 mL | Freq: Once | ORAL | Status: DC

## 2017-10-29 NOTE — Progress Notes (Signed)
PROGRESS NOTE    [name]  ION:[id number] DOB: 1946-08-11 DOA: 10/28/2017 PCP: Merri BrunettePharr, Walter, MD   Brief Narrative:  71 year-old femal with PMH significant for malrotation and SBO requiring adhesiolysis in July 2018 who presented yesterday with 1-day history of nausea and progressive abdominal pain. Imaging shows high-grade SBO with transition point in the upper pelvis right of midline. NGT placed, pt admitted to medicine service, surgery consulted for management of SBO.    Assessment & Plan:   Principal Problem:   SBO (small bowel obstruction), recurrent, s/p adhesiolysis 07/13/2017 Active Problems:   Hypertension  Small bowel obstruction Pt with history of SBO requiring ex lap and LOA in July 2018. Now with abdominal pain and n/v since 11/5. CT with high-grade SBO. NGT in place. Surgery following, pt on SBO protocol. Currently waiting for 8 hour repeat films. Pt remains NPO. Will continue IV hydration, reglan, and Protonix.     Hypertension Controlled. BP 152/78 this morning. Continue with PRN hydralazine for SBP >160 or DBP >90.  Hypokalemia In the setting of SBO and decreased PO intake. K at 3.4 this morning, repleted. Started D5 with K at 100 ml/h. Will recheck BMP in the morning.    DVT prophylaxis: lovenox Code Status: full code Family Communication: at bedside, all concerns addressed Disposition Plan: home    Consultants:   General surgery  Procedures: Antimicrobials:  Subjective:  Abd pain somewhat improved, pt still with nausea. No flatus or BM yesterday or today.     Objective: Vitals:   10/28/17 2030 10/28/17 2115 10/28/17 2224 10/29/17 0523  BP: (!) 151/82 (!) 141/76 (!) 148/91 (!) 152/78  Pulse: 93 93 95 88  Resp: 18  15 15   Temp:   99.1 F (37.3 C) 98.5 F (36.9 C)  TempSrc:   Oral Oral  SpO2: 96% 93% 95% 97%  Weight:    48.3 kg (106 lb 8 oz)  Height:    5\' 5"  (1.651 m)    Intake/Output Summary (Last 24 hours) at 10/29/2017 1250 Last  data filed at 10/29/2017 0715 Gross per 24 hour  Intake 1926.67 ml  Output 600 ml  Net 1326.67 ml   Filed Weights   10/29/17 0523  Weight: 48.3 kg (106 lb 8 oz)    Examination:  General exam: Appears calm and comfortable  Respiratory system: Clear to auscultation. Respiratory effort normal. No wheezing or crackle Cardiovascular system: S1 & S2 heard, RRR.  No pedal edema. Gastrointestinal system: Abdomen is nondistended, soft. No bowel sounds heard. 3 cm well-circumscribed mobile non-tender subcutaneous mass just left of the umbilicus, likely lipoma. Central nervous system: Alert and oriented. No focal neurological deficits. Extremities: Symmetric 5 x 5 power. Skin: No rashes, lesions or ulcers Psychiatry: Judgement and insight appear normal. Mood & affect appropriate.    Data Reviewed: I have personally reviewed following labs and imaging studies  CBC: Recent Labs  Lab 10/28/17 1342 10/29/17 0547  WBC 13.1* 9.4  NEUTROABS  --  7.2  HGB 14.1 13.0  HCT 41.1 38.3  MCV 92.6 93.2  PLT 290 256   Basic Metabolic Panel: Recent Labs  Lab 10/28/17 1342 10/29/17 0547  NA 135 138  K 3.9 3.4*  CL 90* 98*  CO2 32 30  GLUCOSE 138* 104*  BUN 17 19  CREATININE 0.70 0.64  CALCIUM 10.4* 9.0   GFR: Estimated Creatinine Clearance: 48.5 mL/min (by C-G formula based on SCr of 0.64 mg/dL). Liver Function Tests: Recent Labs  Lab 10/28/17 1342  AST 41  ALT 44  ALKPHOS 103  BILITOT 1.0  PROT 8.7*  ALBUMIN 5.1*   Recent Labs  Lab 10/28/17 1342  LIPASE 28   No results for input(s): AMMONIA in the last 168 hours. Coagulation Profile: No results for input(s): INR, PROTIME in the last 168 hours. Cardiac Enzymes: No results for input(s): CKTOTAL, CKMB, CKMBINDEX, TROPONINI in the last 168 hours. BNP (last 3 results) No results for input(s): PROBNP in the last 8760 hours. HbA1C: No results for input(s): HGBA1C in the last 72 hours. CBG: Recent Labs  Lab 10/29/17 0712    GLUCAP 108*   Lipid Profile: No results for input(s): CHOL, HDL, LDLCALC, TRIG, CHOLHDL, LDLDIRECT in the last 72 hours. Thyroid Function Tests: No results for input(s): TSH, T4TOTAL, FREET4, T3FREE, THYROIDAB in the last 72 hours. Anemia Panel: No results for input(s): VITAMINB12, FOLATE, FERRITIN, TIBC, IRON, RETICCTPCT in the last 72 hours. Sepsis Labs: No results for input(s): PROCALCITON, LATICACIDVEN in the last 168 hours.  No results found for this or any previous visit (from the past 240 hour(s)).       Radiology Studies: Dg Abd 1 View  Result Date: 10/29/2017 CLINICAL DATA:  8 hour delayed film.  Small-bowel obstruction EXAM: ABDOMEN - 1 VIEW COMPARISON:  Plain film of the abdomen dated 10/28/2017 and CT abdomen dated 10/28/2017. FINDINGS: Enteric tube appears adequately positioned with tip directed towards the duodenal bulb. Distended gas-filled loops of small bowel are again appreciated within the right lower abdomen, similar to appearance on earlier CT, perhaps slightly improved in caliber compared to the plain film of 10/28/2017. Stool and gas noted within the left colon. No evidence of free intraperitoneal air seen. No acute or suspicious osseous finding. IMPRESSION: 1. Persistently distended gas-filled loops of small bowel within the right lower abdomen, consistent with small bowel obstruction as demonstrated on recent CT abdomen, perhaps slightly improved in caliber compared to the plain film of 10/28/2017. 2. Enteric tube appears well positioned in the stomach with tip directed towards the duodenal bulb. Electronically Signed   By: Bary Richard M.D.   On: 10/29/2017 09:13   Ct Abdomen Pelvis W Contrast  Result Date: 10/28/2017 CLINICAL DATA:  Abdomen pain EXAM: CT ABDOMEN AND PELVIS WITH CONTRAST TECHNIQUE: Multidetector CT imaging of the abdomen and pelvis was performed using the standard protocol following bolus administration of intravenous contrast. CONTRAST:   ISOVUE-300 IOPAMIDOL (ISOVUE-300) INJECTION 61% COMPARISON:  07/11/2017, CT 07/10/2017, 04/30/2008 FINDINGS: Lower chest: Lung bases demonstrate minimal bronchiectasis in the right lower lobe. No acute consolidation or effusion. Heart size within normal limits Hepatobiliary: Stable small cyst in the right hepatic lobe. No calcified gallstones or biliary dilatation. Pancreas: Unremarkable. No pancreatic ductal dilatation or surrounding inflammatory changes. Spleen: Normal in size without focal abnormality. Adrenals/Urinary Tract: Adrenal glands are within normal limits. subcentimeter hypodense lesions within both kidneys too small to further characterize, probable cysts. Bladder within normal limits. Stomach/Bowel: Fluid-filled moderately enlarged stomach. Abnormal position of the duodenum jejunal junction which is seen to the right side of the spine. Multiple enlarged fluid-filled loops of proximal small bowel on the right side of the abdomen with transition white within the upper pelvis, slightly to the right of midline. Slight swirling configuration of the vessels in the region. Malrotated appearance of the bowel with small bowel on the right and colon on the left. The distal small bowel is decompressed. There is no colon wall thickening. There is diverticular disease of the colon. Vascular/Lymphatic: Moderate atherosclerotic  vascular calcification. No aneurysmal dilatation. No abnormal adenopathy Reproductive: Status post hysterectomy. No adnexal masses. Other: Negative for free air. Small amount of free fluid in the pelvis. Musculoskeletal: No acute or suspicious bone lesion IMPRESSION: 1. Re- demonstrated malrotated configuration of the bowel. Moderate fluid-filled enlarged stomach with multiple loops of enlarged fluid-filled proximal small bowel on the right side of the abdomen with transition point visualized in the upper pelvis slightly to the right of midline ; small bowel distal to this as well as the  colon are decompressed. Findings are consistent with high-grade small bowel obstruction, this could be secondary to adhesions given prior surgical history, however slight swirling appearance of the vasculature near the transition point raises possibility of obstruction secondary to a twist in the mesentery. 2. Small amount of free fluid in the pelvis 3. Subcentimeter hypodense lesions in the kidneys too small to further characterize but probably cysts. Electronically Signed   By: Jasmine PangKim  Fujinaga M.D.   On: 10/28/2017 18:24   Dg Abd Portable 1v  Result Date: 10/28/2017 CLINICAL DATA:  71 y/o  F; enteric tube placement. EXAM: PORTABLE ABDOMEN - 1 VIEW COMPARISON:  10/28/2017 abdomen CT FINDINGS: Enteric tube tip projects over gastric body. Contrast is present within the renal collecting systems. Dilated small bowel in right hemiabdomen as seen on CT. Bones are unremarkable. IMPRESSION: Enteric tube tip projects over gastric body. Dilated loops of small bowel in right hemiabdomen. Electronically Signed   By: Mitzi HansenLance  Furusawa-Stratton M.D.   On: 10/28/2017 19:18        Scheduled Meds: . cycloSPORINE  1 drop Both Eyes BID  . enoxaparin (LOVENOX) injection  40 mg Subcutaneous QHS  . metroNIDAZOLE  1 application Topical BID  . pantoprazole (PROTONIX) IV  40 mg Intravenous Daily   Continuous Infusions: . dextrose 5 % and 0.9 % NaCl with KCl 20 mEq/L 100 mL/hr at 10/29/17 0915  . famotidine (PEPCID) IV 20 mg (10/29/17 0915)     LOS: 1 day    Maia Planouglas Lyndon Chapel, PA-S 10/29/2017, 12:50 PM

## 2017-10-29 NOTE — Progress Notes (Signed)
[facility] Progress Note      Assessment/Plan  Principal Problem:   SBO (small bowel obstruction), recurrent, s/p adhesiolysis 07/13/2017 Active Problems:   Hypertension  SBO - likely 2/2 adhesions with hx of multiple abd surgeries - had Ex lap and LOA in July with Dr. Daphine DeutscherMartin - SBO on imaginig - high NGT output, no flatus  FEN: NPO, IVF, reglan for nausea VTE: SCD's, lovenox ID: none Foley:  none Follow up: TBD  DISPO: await read of 8hr delay abd film. Hopefully pt will open up without need for surgery. We will follow closely. Reviewed CT scan and did not appreciate a mass left of umbilicus as found on exam. Perhaps this is a stool ball? Will ask MD to review.     LOS: 1 day    Subjective:  CC: abdominal pain and nausea  Pain is improved. Unsure if distention is better. Still slightly nauseated. No flatus or BM since admission. Husband at bedside and requesting D5 NS with K because her last hospitalization she had low potassium and weight loss. Husband was a MD.   Objective: Vital signs in last 24 hours: Temp:  [98.1 F (36.7 C)-99.1 F (37.3 C)] 98.5 F (36.9 C) (11/07 0523) Pulse Rate:  [88-99] 88 (11/07 0523) Resp:  [15-18] 15 (11/07 0523) BP: (141-173)/(76-102) 152/78 (11/07 0523) SpO2:  [93 %-100 %] 97 % (11/07 0523) Weight:  [106 lb 8 oz (48.3 kg)] 106 lb 8 oz (48.3 kg) (11/07 0523) Last BM Date: 10/27/17  Intake/Output from previous day: 11/06 0701 - 11/07 0700 In: 1926.7 [P.O.:90; I.V.:696.7; NG/GT:90; IV Piggyback:1050] Out: 350 [Emesis/NG output:350] Intake/Output this shift: Total I/O In: -  Out: 250 [Emesis/NG output:250]  PE: Gen:  Alert, NAD, pleasant, cooperative Card:  RRR, no M/G/R heard Pulm:  CTA, no W/R/R, effort normal Abd: Soft, not distended, few normal BS mixed with tinkling, previous midline scar, small lesion superficial lesion noted just left of umbilicus that is firm, mobile and nontender roughly 3cm, pt did  not notice it before. Skin: no rashes noted, warm and dry   Anti-infectives: Anti-infectives (From admission, onward)   None      Lab Results:  Recent Labs    10/28/17 1342 10/29/17 0547  WBC 13.1* 9.4  HGB 14.1 13.0  HCT 41.1 38.3  PLT 290 256   BMET Recent Labs    10/28/17 1342 10/29/17 0547  NA 135 138  K 3.9 3.4*  CL 90* 98*  CO2 32 30  GLUCOSE 138* 104*  BUN 17 19  CREATININE 0.70 0.64  CALCIUM 10.4* 9.0   PT/INR No results for input(s): LABPROT, INR in the last 72 hours. CMP     Component Value Date/Time   NA 138 10/29/2017 0547   K 3.4 (L) 10/29/2017 0547   CL 98 (L) 10/29/2017 0547   CO2 30 10/29/2017 0547   GLUCOSE 104 (H) 10/29/2017 0547   BUN 19 10/29/2017 0547   CREATININE 0.64 10/29/2017 0547   CALCIUM 9.0 10/29/2017 0547   PROT 8.7 (H) 10/28/2017 1342   ALBUMIN 5.1 (H) 10/28/2017 1342   AST 41 10/28/2017 1342   ALT 44 10/28/2017 1342   ALKPHOS 103 10/28/2017 1342   BILITOT 1.0 10/28/2017 1342   GFRNONAA >60 10/29/2017 0547   GFRAA >60 10/29/2017 0547   Lipase     Component Value Date/Time   LIPASE 28 10/28/2017 1342    Studies/Results: Ct Abdomen Pelvis W Contrast  Result Date: 10/28/2017 CLINICAL DATA:  Abdomen  pain EXAM: CT ABDOMEN AND PELVIS WITH CONTRAST TECHNIQUE: Multidetector CT imaging of the abdomen and pelvis was performed using the standard protocol following bolus administration of intravenous contrast. CONTRAST:  100mL ISOVUE-300 IOPAMIDOL (ISOVUE-300) INJECTION 61% COMPARISON:  07/11/2017, CT 07/10/2017, 04/30/2008 FINDINGS: Lower chest: Lung bases demonstrate minimal bronchiectasis in the right lower lobe. No acute consolidation or effusion. Heart size within normal limits Hepatobiliary: Stable small cyst in the right hepatic lobe. No calcified gallstones or biliary dilatation. Pancreas: Unremarkable. No pancreatic ductal dilatation or surrounding inflammatory changes. Spleen: Normal in size without focal abnormality.  Adrenals/Urinary Tract: Adrenal glands are within normal limits. subcentimeter hypodense lesions within both kidneys too small to further characterize, probable cysts. Bladder within normal limits. Stomach/Bowel: Fluid-filled moderately enlarged stomach. Abnormal position of the duodenum jejunal junction which is seen to the right side of the spine. Multiple enlarged fluid-filled loops of proximal small bowel on the right side of the abdomen with transition white within the upper pelvis, slightly to the right of midline. Slight swirling configuration of the vessels in the region. Malrotated appearance of the bowel with small bowel on the right and colon on the left. The distal small bowel is decompressed. There is no colon wall thickening. There is diverticular disease of the colon. Vascular/Lymphatic: Moderate atherosclerotic vascular calcification. No aneurysmal dilatation. No abnormal adenopathy Reproductive: Status post hysterectomy. No adnexal masses. Other: Negative for free air. Small amount of free fluid in the pelvis. Musculoskeletal: No acute or suspicious bone lesion IMPRESSION: 1. Re- demonstrated malrotated configuration of the bowel. Moderate fluid-filled enlarged stomach with multiple loops of enlarged fluid-filled proximal small bowel on the right side of the abdomen with transition point visualized in the upper pelvis slightly to the right of midline ; small bowel distal to this as well as the colon are decompressed. Findings are consistent with high-grade small bowel obstruction, this could be secondary to adhesions given prior surgical history, however slight swirling appearance of the vasculature near the transition point raises possibility of obstruction secondary to a twist in the mesentery. 2. Small amount of free fluid in the pelvis 3. Subcentimeter hypodense lesions in the kidneys too small to further characterize but probably cysts. Electronically Signed   By: Jasmine PangKim  Fujinaga M.D.   On:  10/28/2017 18:24   Dg Abd Portable 1v  Result Date: 10/28/2017 CLINICAL DATA:  71 y/o  F; enteric tube placement. EXAM: PORTABLE ABDOMEN - 1 VIEW COMPARISON:  10/28/2017 abdomen CT FINDINGS: Enteric tube tip projects over gastric body. Contrast is present within the renal collecting systems. Dilated small bowel in right hemiabdomen as seen on CT. Bones are unremarkable. IMPRESSION: Enteric tube tip projects over gastric body. Dilated loops of small bowel in right hemiabdomen. Electronically Signed   By: Mitzi HansenLance  Furusawa-Stratton M.D.   On: 10/28/2017 19:18      Jerre SimonJessica L Nephi Savage , [facility] 10/29/2017, 8:29 AM Pager: [phone] Consults: [phone] Mon-Fri 7:00 am-4:30 pm Sat-Sun 7:00 am-11:30 am

## 2017-10-30 ENCOUNTER — Inpatient Hospital Stay (HOSPITAL_COMMUNITY): Payer: Medicare HMO

## 2017-10-30 ENCOUNTER — Other Ambulatory Visit: Payer: Self-pay

## 2017-10-30 LAB — BASIC METABOLIC PANEL
ANION GAP: 5 (ref 5–15)
BUN: 12 mg/dL (ref 6–20)
CHLORIDE: 110 mmol/L (ref 101–111)
CO2: 28 mmol/L (ref 22–32)
Calcium: 8.2 mg/dL — ABNORMAL LOW (ref 8.9–10.3)
Creatinine, Ser: 0.5 mg/dL (ref 0.44–1.00)
GFR calc Af Amer: >60 mL/min (ref >60)
GLUCOSE: 111 mg/dL — AB (ref 65–99)
POTASSIUM: 3.7 mmol/L (ref 3.5–5.1)
Sodium: 143 mmol/L (ref 135–145)

## 2017-10-30 LAB — GLUCOSE, CAPILLARY: Glucose-Capillary: 109 mg/dL — ABNORMAL HIGH (ref 65–99)

## 2017-10-30 LAB — MAGNESIUM: MAGNESIUM: 2.1 mg/dL (ref 1.7–2.4)

## 2017-10-30 MED ORDER — PANTOPRAZOLE SODIUM 40 MG PO TBEC
40.0000 mg | DELAYED_RELEASE_TABLET | Freq: Every day | ORAL | Status: DC
  Administered 2017-10-30 – 2017-10-31 (×2): 40 mg via ORAL
  Filled 2017-10-30 (×2): qty 1

## 2017-10-30 MED ORDER — AMLODIPINE BESYLATE 5 MG PO TABS
2.5000 mg | ORAL_TABLET | Freq: Every day | ORAL | Status: DC
  Administered 2017-10-30 – 2017-10-31 (×2): 2.5 mg via ORAL
  Filled 2017-10-30 (×2): qty 1

## 2017-10-30 MED ORDER — ROPINIROLE HCL 0.5 MG PO TABS
0.2500 mg | ORAL_TABLET | Freq: Every day | ORAL | Status: DC
  Administered 2017-10-30: 0.25 mg via ORAL
  Filled 2017-10-30: qty 1

## 2017-10-30 NOTE — Progress Notes (Signed)
PROGRESS NOTE  [name] UJW:[id number]:[id number] DOB: 10/03/1946 DOA: 10/28/2017 PCP: Merri BrunettePharr, Walter, MD   LOS: 2 days   Brief Narrative / Interim history: 71 year-old femal with PMH significant for malrotation and SBO requiring adhesiolysis in July 2018 who presented yesterday with 1-day history of nausea and progressive abdominal pain. Imaging shows high-grade SBO with transition point in the upper pelvis right of midline. NGT placed, pt admitted to medicine service, surgery consulted for management of SBO.   Assessment & Plan: Principal Problem:   SBO (small bowel obstruction), recurrent, s/p adhesiolysis 07/13/2017 Active Problems:   Hypertension   Hypokalemia   Intestinal malrotation   Small bowel obstruction Pt with history of SBO requiring ex lap and LOA in July 2018. Now with abdominal pain and n/v since 11/5. CT with high-grade SBO. NGT in place but not to suction. Surgery following, pt on SBO protocol. Most recent imaging shows some resolution. Pt had BM this morning and has had +flatus. Tolerating liquids this morning, will advance diet as tolerated. Will continue IV hydration, reglan, and Protonix for now.     Hypertension Controlled. BP 138/80 this morning. Continue with amlodipine.   Hypokalemia In the setting of SBO and decreased PO intake. K at 3.7 this morning, mg 2.1. Continuing D5 with K at 100 ml/h. Will recheck BMP in the morning.    DVT prophylaxis: Lovenox Code Status: full code Family Communication: not at bedside Disposition Plan: home  Consultants:   General surgery  Procedures:     Antimicrobials:    Subjective: Feeling better today, pain resolving. Pt had a BM this morning and +flatus. Tolerating PO liquids now.   Objective: Vitals:   10/29/17 1341 10/29/17 2130 10/30/17 0500 10/30/17 0530  BP: 132/73 (!) 154/73  138/80  Pulse: 82 82  83  Resp: 16 16  14   Temp: 98.3 F (36.8 C) 98.3 F (36.8 C)  98.6 F (37 C)  TempSrc: Oral Oral  Oral   SpO2: 100% 97%  97%  Weight:   49.1 kg (108 lb 3 oz)   Height:        Intake/Output Summary (Last 24 hours) at 10/30/2017 1045 Last data filed at 10/30/2017 0700 Gross per 24 hour  Intake 2155 ml  Output 450 ml  Net 1705 ml   Filed Weights   10/29/17 0523 10/30/17 0500  Weight: 48.3 kg (106 lb 8 oz) 49.1 kg (108 lb 3 oz)    Examination:  Constitutional: non-distressed Eyes: lids and conjunctivae normal ENMT: Mucous membranes are moist.  Neck: normal, supple, no masses Respiratory: clear to auscultation bilaterally, no wheezing, no crackles. Normal respiratory effort. No accessory muscle use.  Cardiovascular: Regular rate and rhythm, no murmurs / rubs / gallops. No LE edema. 2+ pedal pulses. No carotid bruits.  Abdomen: no tenderness. Bowel sounds positive.3 cm well-circumscribed mobile non-tender subcutaneous mass just left of the umbilicus, likely lipoma. Musculoskeletal: no clubbing / cyanosis. No joint deformity upper and lower extremities. No contractures. Normal muscle tone.  Skin: no rashes, lesions, ulcers. No induration Neurologic: grossly non-focal Psychiatric: Normal judgment and insight. Alert and oriented x 3. Normal mood.    Data Reviewed: I have independently reviewed following labs and imaging studies   CBC: Recent Labs  Lab 10/28/17 1342 10/29/17 0547  WBC 13.1* 9.4  NEUTROABS  --  7.2  HGB 14.1 13.0  HCT 41.1 38.3  MCV 92.6 93.2  PLT 290 256   Basic Metabolic Panel: Recent Labs  Lab 10/28/17 1342 10/29/17  [id number] 10/30/17 0538  NA 135 138 143  K 3.9 3.4* 3.7  CL 90* 98* 110  CO2 32 30 28  GLUCOSE 138* 104* 111*  BUN 17 19 12   CREATININE 0.70 0.64 0.50  CALCIUM 10.4* 9.0 8.2*  MG  --   --  2.1   GFR: Estimated Creatinine Clearance: 49.3 mL/min (by C-G formula based on SCr of 0.5 mg/dL). Liver Function Tests: Recent Labs  Lab 10/28/17 1342  AST 41  ALT 44  ALKPHOS 103  BILITOT 1.0  PROT 8.7*  ALBUMIN 5.1*   Recent Labs  Lab  10/28/17 1342  LIPASE 28   No results for input(s): AMMONIA in the last 168 hours. Coagulation Profile: No results for input(s): INR, PROTIME in the last 168 hours. Cardiac Enzymes: No results for input(s): CKTOTAL, CKMB, CKMBINDEX, TROPONINI in the last 168 hours. BNP (last 3 results) No results for input(s): PROBNP in the last 8760 hours. HbA1C: No results for input(s): HGBA1C in the last 72 hours. CBG: Recent Labs  Lab 10/29/17 0712 10/30/17 0818  GLUCAP 108* 109*   Lipid Profile: No results for input(s): CHOL, HDL, LDLCALC, TRIG, CHOLHDL, LDLDIRECT in the last 72 hours. Thyroid Function Tests: No results for input(s): TSH, T4TOTAL, FREET4, T3FREE, THYROIDAB in the last 72 hours. Anemia Panel: No results for input(s): VITAMINB12, FOLATE, FERRITIN, TIBC, IRON, RETICCTPCT in the last 72 hours. Urine analysis:    Component Value Date/Time   COLORURINE YELLOW 10/28/2017 1734   APPEARANCEUR CLEAR 10/28/2017 1734   LABSPEC 1.019 10/28/2017 1734   PHURINE 6.0 10/28/2017 1734   GLUCOSEU NEGATIVE 10/28/2017 1734   HGBUR NEGATIVE 10/28/2017 1734   BILIRUBINUR NEGATIVE 10/28/2017 1734   KETONESUR 20 (A) 10/28/2017 1734   PROTEINUR 100 (A) 10/28/2017 1734   UROBILINOGEN 0.2 10/18/2008 0626   NITRITE NEGATIVE 10/28/2017 1734   LEUKOCYTESUR NEGATIVE 10/28/2017 1734   Sepsis Labs: Invalid input(s): PROCALCITONIN, LACTICIDVEN  No results found for this or any previous visit (from the past 240 hour(s)).    Radiology Studies: Dg Abd 1 View  Result Date: 10/29/2017 CLINICAL DATA:  8 hour delayed film.  Small-bowel obstruction EXAM: ABDOMEN - 1 VIEW COMPARISON:  Plain film of the abdomen dated 10/28/2017 and CT abdomen dated 10/28/2017. FINDINGS: Enteric tube appears adequately positioned with tip directed towards the duodenal bulb. Distended gas-filled loops of small bowel are again appreciated within the right lower abdomen, similar to appearance on earlier CT, perhaps slightly  improved in caliber compared to the plain film of 10/28/2017. Stool and gas noted within the left colon. No evidence of free intraperitoneal air seen. No acute or suspicious osseous finding. IMPRESSION: 1. Persistently distended gas-filled loops of small bowel within the right lower abdomen, consistent with small bowel obstruction as demonstrated on recent CT abdomen, perhaps slightly improved in caliber compared to the plain film of 10/28/2017. 2. Enteric tube appears well positioned in the stomach with tip directed towards the duodenal bulb. Electronically Signed   By: Bary RichardStan  Maynard M.D.   On: 10/29/2017 09:13   Ct Abdomen Pelvis W Contrast  Result Date: 10/28/2017 CLINICAL DATA:  Abdomen pain EXAM: CT ABDOMEN AND PELVIS WITH CONTRAST TECHNIQUE: Multidetector CT imaging of the abdomen and pelvis was performed using the standard protocol following bolus administration of intravenous contrast. CONTRAST:  100mL ISOVUE-300 IOPAMIDOL (ISOVUE-300) INJECTION 61% COMPARISON:  07/11/2017, CT 07/10/2017, 04/30/2008 FINDINGS: Lower chest: Lung bases demonstrate minimal bronchiectasis in the right lower lobe. No acute consolidation or effusion. Heart size within normal  limits Hepatobiliary: Stable small cyst in the right hepatic lobe. No calcified gallstones or biliary dilatation. Pancreas: Unremarkable. No pancreatic ductal dilatation or surrounding inflammatory changes. Spleen: Normal in size without focal abnormality. Adrenals/Urinary Tract: Adrenal glands are within normal limits. subcentimeter hypodense lesions within both kidneys too small to further characterize, probable cysts. Bladder within normal limits. Stomach/Bowel: Fluid-filled moderately enlarged stomach. Abnormal position of the duodenum jejunal junction which is seen to the right side of the spine. Multiple enlarged fluid-filled loops of proximal small bowel on the right side of the abdomen with transition white within the upper pelvis, slightly to the  right of midline. Slight swirling configuration of the vessels in the region. Malrotated appearance of the bowel with small bowel on the right and colon on the left. The distal small bowel is decompressed. There is no colon wall thickening. There is diverticular disease of the colon. Vascular/Lymphatic: Moderate atherosclerotic vascular calcification. No aneurysmal dilatation. No abnormal adenopathy Reproductive: Status post hysterectomy. No adnexal masses. Other: Negative for free air. Small amount of free fluid in the pelvis. Musculoskeletal: No acute or suspicious bone lesion IMPRESSION: 1. Re- demonstrated malrotated configuration of the bowel. Moderate fluid-filled enlarged stomach with multiple loops of enlarged fluid-filled proximal small bowel on the right side of the abdomen with transition point visualized in the upper pelvis slightly to the right of midline ; small bowel distal to this as well as the colon are decompressed. Findings are consistent with high-grade small bowel obstruction, this could be secondary to adhesions given prior surgical history, however slight swirling appearance of the vasculature near the transition point raises possibility of obstruction secondary to a twist in the mesentery. 2. Small amount of free fluid in the pelvis 3. Subcentimeter hypodense lesions in the kidneys too small to further characterize but probably cysts. Electronically Signed   By: Jasmine Pang M.D.   On: 10/28/2017 18:24   Dg Abd 2 Views  Result Date: 10/30/2017 CLINICAL DATA:  Small bowel protocol 8 hour delay EXAM: ABDOMEN - 2 VIEW COMPARISON:  10/29/2017, 10/28/2017 FINDINGS: Esophageal tube tip is in the right upper quadrant. Contrast is present within the colon which is left-sided consistent with history of malrotation. There is some dilute contrast present within dilated small bowel in the right abdomen. IMPRESSION: Majority of the contrast is in the colon which is left-sided consistent with history  of malrotation. Some dilute contrast is present within a dilated segment of small bowel in the right abdomen, overall the small bowel dilatation is decreased compared to prior radiograph. Electronically Signed   By: Jasmine Pang M.D.   On: 10/30/2017 02:55   Dg Abd Portable 1v  Result Date: 10/28/2017 CLINICAL DATA:  71 y/o  F; enteric tube placement. EXAM: PORTABLE ABDOMEN - 1 VIEW COMPARISON:  10/28/2017 abdomen CT FINDINGS: Enteric tube tip projects over gastric body. Contrast is present within the renal collecting systems. Dilated small bowel in right hemiabdomen as seen on CT. Bones are unremarkable. IMPRESSION: Enteric tube tip projects over gastric body. Dilated loops of small bowel in right hemiabdomen. Electronically Signed   By: Mitzi Hansen M.D.   On: 10/28/2017 19:18     Scheduled Meds: . amLODipine  2.5 mg Oral Daily  . cycloSPORINE  1 drop Both Eyes BID  . enoxaparin (LOVENOX) injection  40 mg Subcutaneous QHS  . metroNIDAZOLE  1 application Topical BID  . pantoprazole  40 mg Oral Daily  . rOPINIRole  0.25 mg Oral QHS   Continuous Infusions: .  dextrose 5 % and 0.9 % NaCl with KCl 20 mEq/L 100 mL/hr at 10/30/17 0531       Time spent:15 minutes    Elinor Dodge, PA-S 10/30/2017, 10:45 AM   @[email or web address] @

## 2017-10-30 NOTE — Progress Notes (Signed)
Initial Nutrition Assessment  DOCUMENTATION CODES:   Underweight  INTERVENTION:   -Diet advancement per MD -Husband to bring Boost supplements from home -Will order [employer] Instant Breakfast BID once diet is advanced. Each provides 280 kcal and 13g of protein.  -RD will continue to monitor  NUTRITION DIAGNOSIS:   Inadequate oral intake related to poor appetite(history of SBO in July) as evidenced by per patient/family report.  GOAL:   Patient will meet greater than or equal to 90% of their needs  MONITOR:   PO intake, Supplement acceptance, Labs, Weight trends, I & O's  REASON FOR ASSESSMENT:   (Low BMI)    ASSESSMENT:   71 year-old femal with PMH significant for malrotation and SBO requiring adhesiolysis in July 2018 who presented yesterday with 1-day history of nausea and progressive abdominal pain. Imaging shows high-grade SBO with transition point in the upper pelvis right of midline. NGT placed, pt admitted to medicine service, surgery consulted for management of SBO.  Patient in room with husband at bedside. Pt eating some jello on clear liquid tray. Pt tolerating clears and reports no nausea. Per surgery note, pt's diet will be advanced to full liquids and NGT will be removed.  Pt states that she was eating normally prior to SBO and surgery in July 2018. Pt's UBW at that time was 110-112 lb. Weight dropped to ~100 lb after that hospitalization. Pt has been trying to gain weight since then and started drinking Boost drinks daily.  Weight is now 108 lb.  Pt was eating fine until Monday night when she had scallops for dinner. She then developed N/V for 1 day.  Pt would like husband to bring Boosts from home and she is willing to try [employer] Instant Breakfast drinks here as well.  Labs reviewed. Medications: Protonix tablet daily, D5 and .9% NaCl w/ KCl infusion at 100 ml/hr -provide 408 kcal  NUTRITION - FOCUSED PHYSICAL EXAM:    Most Recent Value  Orbital Region   No depletion  Upper Arm Region  Moderate depletion  Thoracic and Lumbar Region  Unable to assess  Buccal Region  Mild depletion  Temple Region  Mild depletion  Clavicle Bone Region  Moderate depletion  Clavicle and Acromion Bone Region  Moderate depletion  Scapular Bone Region  Moderate depletion  Dorsal Hand  Moderate depletion  Patellar Region  Unable to assess  Anterior Thigh Region  Unable to assess  Posterior Calf Region  Unable to assess  Edema (RD Assessment)  None       Diet Order:  Diet clear liquid Room service appropriate? Yes; Fluid consistency: Thin  EDUCATION NEEDS:   Education needs have been addressed  Skin:  Skin Assessment: Reviewed RN Assessment  Last BM:  11/8  Height:   Ht Readings from Last 1 Encounters:  10/29/17 5\' 5"  (1.651 m)    Weight:   Wt Readings from Last 1 Encounters:  10/30/17 108 lb 3 oz (49.1 kg)    Ideal Body Weight:  56.8 kg  BMI:  Body mass index is 18 kg/m.  Estimated Nutritional Needs:   Kcal:  1300-1500  Protein:  60-70g  Fluid:  1.5L/day  Tilda FrancoLindsey Adyn Hoes, MS, RD, LDN [facility] Clinical Dietitian Pager: [phone] After Hours Pager: [phone]

## 2017-10-30 NOTE — Progress Notes (Signed)
[facility] Progress Note      Assessment/Plan Principal Problem:   SBO (small bowel obstruction), recurrent, s/p adhesiolysis 07/13/2017 Active Problems:   Hypertension  SBO - likely 2/2 adhesions with hx of multiple abd surgeries - had Ex lap and LOA in July with Dr. Daphine DeutscherMartin - contrast in colon on abd films this am, had to give gastrografin again yesterday due to no contrast seen on films yesterday morning. Likely tube was not disconnected from suction after administration of contrast the first time.   FEN: clears VTE: SCD's, lovenox ID: none Foley:  none Follow up: TBD  DISPO: clears, pull NGT, advance diet to fulls as tolerated today      LOS: 2 days    Subjective:  CC: SBO  Pt is feeling better and had a BM and flatus. No nausea. Mild abdominal soreness.  Objective: Vital signs in last 24 hours: Temp:  [98.3 F (36.8 C)-98.6 F (37 C)] 98.6 F (37 C) (11/08 0530) Pulse Rate:  [82-83] 83 (11/08 0530) Resp:  [14-16] 14 (11/08 0530) BP: (132-154)/(73-80) 138/80 (11/08 0530) SpO2:  [97 %-100 %] 97 % (11/08 0530) Weight:  [108 lb 3 oz (49.1 kg)] 108 lb 3 oz (49.1 kg) (11/08 0500) Last BM Date: 10/27/17  Intake/Output from previous day: 11/07 0701 - 11/08 0700 In: 2595 [P.O.:30; I.V.:2515; IV Piggyback:50] Out: 700 [Emesis/NG output:700] Intake/Output this shift: No intake/output data recorded.  PE: Gen:  Alert, NAD, pleasant, cooperative Card:  RRR, no M/G/R heard Pulm:  rate and effort normal Abd: Soft, not distended, +BS, previous midline scar, small lesion superficial lesion noted just left of umbilicus that is firm, mobile and nontender roughly 3cm, pt did not notice it before. Mild soreness on palpation without guarding Skin: no rashes noted, warm and dry    Anti-infectives: Anti-infectives (From admission, onward)   None      Lab Results:  Recent Labs    10/28/17 1342 10/29/17 0547  WBC 13.1* 9.4  HGB 14.1 13.0   HCT 41.1 38.3  PLT 290 256   BMET Recent Labs    10/29/17 0547 10/30/17 0538  NA 138 143  K 3.4* 3.7  CL 98* 110  CO2 30 28  GLUCOSE 104* 111*  BUN 19 12  CREATININE 0.64 0.50  CALCIUM 9.0 8.2*   PT/INR No results for input(s): LABPROT, INR in the last 72 hours. CMP     Component Value Date/Time   NA 143 10/30/2017 0538   K 3.7 10/30/2017 0538   CL 110 10/30/2017 0538   CO2 28 10/30/2017 0538   GLUCOSE 111 (H) 10/30/2017 0538   BUN 12 10/30/2017 0538   CREATININE 0.50 10/30/2017 0538   CALCIUM 8.2 (L) 10/30/2017 0538   PROT 8.7 (H) 10/28/2017 1342   ALBUMIN 5.1 (H) 10/28/2017 1342   AST 41 10/28/2017 1342   ALT 44 10/28/2017 1342   ALKPHOS 103 10/28/2017 1342   BILITOT 1.0 10/28/2017 1342   GFRNONAA >60 10/30/2017 0538   GFRAA >60 10/30/2017 0538   Lipase     Component Value Date/Time   LIPASE 28 10/28/2017 1342    Studies/Results: Dg Abd 1 View  Result Date: 10/29/2017 CLINICAL DATA:  8 hour delayed film.  Small-bowel obstruction EXAM: ABDOMEN - 1 VIEW COMPARISON:  Plain film of the abdomen dated 10/28/2017 and CT abdomen dated 10/28/2017. FINDINGS: Enteric tube appears adequately positioned with tip directed towards the duodenal bulb. Distended gas-filled loops of small bowel are again appreciated within the  right lower abdomen, similar to appearance on earlier CT, perhaps slightly improved in caliber compared to the plain film of 10/28/2017. Stool and gas noted within the left colon. No evidence of free intraperitoneal air seen. No acute or suspicious osseous finding. IMPRESSION: 1. Persistently distended gas-filled loops of small bowel within the right lower abdomen, consistent with small bowel obstruction as demonstrated on recent CT abdomen, perhaps slightly improved in caliber compared to the plain film of 10/28/2017. 2. Enteric tube appears well positioned in the stomach with tip directed towards the duodenal bulb. Electronically Signed   By: Bary RichardStan  Maynard  M.D.   On: 10/29/2017 09:13   Ct Abdomen Pelvis W Contrast  Result Date: 10/28/2017 CLINICAL DATA:  Abdomen pain EXAM: CT ABDOMEN AND PELVIS WITH CONTRAST TECHNIQUE: Multidetector CT imaging of the abdomen and pelvis was performed using the standard protocol following bolus administration of intravenous contrast. CONTRAST:  100mL ISOVUE-300 IOPAMIDOL (ISOVUE-300) INJECTION 61% COMPARISON:  07/11/2017, CT 07/10/2017, 04/30/2008 FINDINGS: Lower chest: Lung bases demonstrate minimal bronchiectasis in the right lower lobe. No acute consolidation or effusion. Heart size within normal limits Hepatobiliary: Stable small cyst in the right hepatic lobe. No calcified gallstones or biliary dilatation. Pancreas: Unremarkable. No pancreatic ductal dilatation or surrounding inflammatory changes. Spleen: Normal in size without focal abnormality. Adrenals/Urinary Tract: Adrenal glands are within normal limits. subcentimeter hypodense lesions within both kidneys too small to further characterize, probable cysts. Bladder within normal limits. Stomach/Bowel: Fluid-filled moderately enlarged stomach. Abnormal position of the duodenum jejunal junction which is seen to the right side of the spine. Multiple enlarged fluid-filled loops of proximal small bowel on the right side of the abdomen with transition white within the upper pelvis, slightly to the right of midline. Slight swirling configuration of the vessels in the region. Malrotated appearance of the bowel with small bowel on the right and colon on the left. The distal small bowel is decompressed. There is no colon wall thickening. There is diverticular disease of the colon. Vascular/Lymphatic: Moderate atherosclerotic vascular calcification. No aneurysmal dilatation. No abnormal adenopathy Reproductive: Status post hysterectomy. No adnexal masses. Other: Negative for free air. Small amount of free fluid in the pelvis. Musculoskeletal: No acute or suspicious bone lesion  IMPRESSION: 1. Re- demonstrated malrotated configuration of the bowel. Moderate fluid-filled enlarged stomach with multiple loops of enlarged fluid-filled proximal small bowel on the right side of the abdomen with transition point visualized in the upper pelvis slightly to the right of midline ; small bowel distal to this as well as the colon are decompressed. Findings are consistent with high-grade small bowel obstruction, this could be secondary to adhesions given prior surgical history, however slight swirling appearance of the vasculature near the transition point raises possibility of obstruction secondary to a twist in the mesentery. 2. Small amount of free fluid in the pelvis 3. Subcentimeter hypodense lesions in the kidneys too small to further characterize but probably cysts. Electronically Signed   By: Jasmine PangKim  Fujinaga M.D.   On: 10/28/2017 18:24   Dg Abd 2 Views  Result Date: 10/30/2017 CLINICAL DATA:  Small bowel protocol 8 hour delay EXAM: ABDOMEN - 2 VIEW COMPARISON:  10/29/2017, 10/28/2017 FINDINGS: Esophageal tube tip is in the right upper quadrant. Contrast is present within the colon which is left-sided consistent with history of malrotation. There is some dilute contrast present within dilated small bowel in the right abdomen. IMPRESSION: Majority of the contrast is in the colon which is left-sided consistent with history of malrotation. Some dilute  contrast is present within a dilated segment of small bowel in the right abdomen, overall the small bowel dilatation is decreased compared to prior radiograph. Electronically Signed   By: Jasmine Pang M.D.   On: 10/30/2017 02:55   Dg Abd Portable 1v  Result Date: 10/28/2017 CLINICAL DATA:  71 y/o  F; enteric tube placement. EXAM: PORTABLE ABDOMEN - 1 VIEW COMPARISON:  10/28/2017 abdomen CT FINDINGS: Enteric tube tip projects over gastric body. Contrast is present within the renal collecting systems. Dilated small bowel in right hemiabdomen as  seen on CT. Bones are unremarkable. IMPRESSION: Enteric tube tip projects over gastric body. Dilated loops of small bowel in right hemiabdomen. Electronically Signed   By: Mitzi Hansen M.D.   On: 10/28/2017 19:18      Jerre Simon , [facility] 10/30/2017, 9:07 AM Pager: [phone] Consults: [phone] Mon-Fri 7:00 am-4:30 pm Sat-Sun 7:00 am-11:30 am

## 2017-10-31 DIAGNOSIS — K56609 Unspecified intestinal obstruction, unspecified as to partial versus complete obstruction: Secondary | ICD-10-CM

## 2017-10-31 DIAGNOSIS — Q433 Congenital malformations of intestinal fixation: Secondary | ICD-10-CM

## 2017-10-31 DIAGNOSIS — E876 Hypokalemia: Secondary | ICD-10-CM

## 2017-10-31 DIAGNOSIS — I10 Essential (primary) hypertension: Secondary | ICD-10-CM

## 2017-10-31 LAB — BASIC METABOLIC PANEL
Anion gap: 6 (ref 5–15)
BUN: 9 mg/dL (ref 6–20)
CO2: 26 mmol/L (ref 22–32)
CREATININE: 0.41 mg/dL — AB (ref 0.44–1.00)
Calcium: 8 mg/dL — ABNORMAL LOW (ref 8.9–10.3)
Chloride: 109 mmol/L (ref 101–111)
GFR calc Af Amer: >60 mL/min (ref >60)
GLUCOSE: 89 mg/dL (ref 65–99)
Potassium: 3.5 mmol/L (ref 3.5–5.1)
SODIUM: 141 mmol/L (ref 135–145)

## 2017-10-31 LAB — MAGNESIUM: MAGNESIUM: 2.1 mg/dL (ref 1.7–2.4)

## 2017-10-31 LAB — GLUCOSE, CAPILLARY: Glucose-Capillary: 105 mg/dL — ABNORMAL HIGH (ref 65–99)

## 2017-10-31 NOTE — Progress Notes (Signed)
[facility]/Trauma Progress Note      Assessment/Plan Principal Problem: SBO (small bowel obstruction), recurrent, s/p adhesiolysis 07/13/2017 Active Problems: Hypertension  SBO - likely 2/2 adhesions with hx of multiple abd surgeries - had Ex lap and LOA in July with Dr. Daphine DeutscherMartin - contrast in colon on abd films 11/08  ZOX:WRUEAFEN:fulls and advance as tolerated VTE: SCD's, lovenox VW:UJWJ:none Foley:none Follow up:TBD  DISPO:fulls, advance diet to soft as tolerated and if so pt is clear for discharge this afternoon from a surgical standpoint. Told pt yesterday to watch mass in abdomen and if it causes pain or gets larger to have PCP look at it.     LOS: 3 days    Subjective:  CC: SBO  Pt feeling much better. Having loose BM's and flatus. No nausea or vomiting. Husband at bedside.   Objective: Vital signs in last 24 hours: Temp:  [98 F (36.7 C)-98.4 F (36.9 C)] 98.4 F (36.9 C) (11/09 0519) Pulse Rate:  [71-82] 71 (11/09 0519) Resp:  [16] 16 (11/09 0519) BP: (123-143)/(71-83) 137/79 (11/09 0519) SpO2:  [99 %-100 %] 99 % (11/09 0519) Last BM Date: 10/30/17  Intake/Output from previous day: 11/08 0701 - 11/09 0700 In: 1440 [P.O.:640; I.V.:800] Out: 150 [Emesis/NG output:150] Intake/Output this shift: No intake/output data recorded.  PE: Gen: Alert, NAD, pleasant, cooperative Card: RRR, no M/G/R heard Pulm: rate and effort normal Abd: Soft,not distended, +BS,previous midline scar, small lesion superficial lesion noted just left of umbilicus that is firm, mobile and nontender roughly 3cm, pt did not notice it before. No TTP Skin: no rashes noted, warm and dry     Anti-infectives: Anti-infectives (From admission, onward)   None      Lab Results:  Recent Labs    10/28/17 1342 10/29/17 0547  WBC 13.1* 9.4  HGB 14.1 13.0  HCT 41.1 38.3  PLT 290 256   BMET Recent Labs    10/30/17 0538 10/31/17 0511  NA 143 141  K 3.7 3.5   CL 110 109  CO2 28 26  GLUCOSE 111* 89  BUN 12 9  CREATININE 0.50 0.41*  CALCIUM 8.2* 8.0*   PT/INR No results for input(s): LABPROT, INR in the last 72 hours. CMP     Component Value Date/Time   NA 141 10/31/2017 0511   K 3.5 10/31/2017 0511   CL 109 10/31/2017 0511   CO2 26 10/31/2017 0511   GLUCOSE 89 10/31/2017 0511   BUN 9 10/31/2017 0511   CREATININE 0.41 (L) 10/31/2017 0511   CALCIUM 8.0 (L) 10/31/2017 0511   PROT 8.7 (H) 10/28/2017 1342   ALBUMIN 5.1 (H) 10/28/2017 1342   AST 41 10/28/2017 1342   ALT 44 10/28/2017 1342   ALKPHOS 103 10/28/2017 1342   BILITOT 1.0 10/28/2017 1342   GFRNONAA >60 10/31/2017 0511   GFRAA >60 10/31/2017 0511   Lipase     Component Value Date/Time   LIPASE 28 10/28/2017 1342    Studies/Results: Dg Abd 2 Views  Result Date: 10/30/2017 CLINICAL DATA:  Small bowel protocol 8 hour delay EXAM: ABDOMEN - 2 VIEW COMPARISON:  10/29/2017, 10/28/2017 FINDINGS: Esophageal tube tip is in the right upper quadrant. Contrast is present within the colon which is left-sided consistent with history of malrotation. There is some dilute contrast present within dilated small bowel in the right abdomen. IMPRESSION: Majority of the contrast is in the colon which is left-sided consistent with history of malrotation. Some dilute contrast is present within a dilated segment  of small bowel in the right abdomen, overall the small bowel dilatation is decreased compared to prior radiograph. Electronically Signed   By: Jasmine PangKim  Fujinaga M.D.   On: 10/30/2017 02:55      Jerre SimonJessica L Aanvi Voyles , [facility] 10/31/2017, 8:49 AM Pager: [phone] Consults: [phone] Mon-Fri 7:00 am-4:30 pm Sat-Sun 7:00 am-11:30 am

## 2017-10-31 NOTE — Progress Notes (Signed)
Discussed with patient discharge instructions, she verbalized agreement and understanding.  Patient to go home with all belongings in a private vehicle.  

## 2017-10-31 NOTE — Discharge Summary (Signed)
Physician Discharge Summary  [name] WUJ:[id number] DOB: 1946-07-24 DOA: 10/28/2017  PCP: Merri Brunette, MD  Admit date: 10/28/2017 Discharge date: 10/31/2017  Admitted From: Home Disposition: Home   Recommendations for Outpatient Follow-up:  1. Follow up with PCP in 1-2 weeks.  Home Health: None Equipment/Devices: None Discharge Condition: Stable CODE STATUS: Full Diet recommendation: Soft as tolerated.  Brief/Interim Summary: 71 year-old femal with PMH significant for malrotation and SBO requiring adhesiolysis in July 2018 who presented yesterday with 1-day history of nausea and progressive abdominal pain. Imaging shows high-grade SBO with transition point in the upper pelvis right of midline. NGT placed, pt admitted to medicine service, surgery consulted for management of SBO. Symptoms resolved spontaneously, follow up XR showing contrast in the colon shortly thereafter large BM and resolution of symptoms.   Discharge Diagnoses:  Principal Problem:   SBO (small bowel obstruction), recurrent, s/p adhesiolysis 07/13/2017 Active Problems:   Hypertension   Hypokalemia   Intestinal malrotation  Small bowel obstruction: Clinically has resolved with contrast in colon on repeat imaging, BM, resolution of symptoms.  - Due to adhesions with multiple abdominal surgeries in the past, most recently LOA July with Dr. Daphine Deutscher.  - Tolerated advanced diet, plan to DC and follow up with general surgery.   Hypertension - Continue home medications  GERD: Chronic, stable - Continue omeprazole  Hypokalemia: Resolved.   Leukocytosis: Resolved.  Discharge Instructions Discharge Instructions    Diet - low sodium heart healthy   Complete by:  As directed    Discharge instructions   Complete by:  As directed    - Continue taking your home medications as you were, taking care to avoid narcotics as able - Eat small volumes of soft food frequently for the next few days and advanced as  tolerated - If your symptoms return, seek medical attention right away.   Increase activity slowly   Complete by:  As directed      Allergies as of 10/31/2017   No Known Allergies     Medication List    STOP taking these medications   HYDROcodone-acetaminophen 5-325 MG tablet Commonly known as:  NORCO/VICODIN     TAKE these medications   amLODipine 5 MG tablet Commonly known as:  NORVASC Take 2.5 mg by mouth daily.   cetirizine 10 MG tablet Commonly known as:  ZYRTEC Take 10 mg daily by mouth.   cycloSPORINE 0.05 % ophthalmic emulsion Commonly known as:  RESTASIS Place 1 drop into both eyes 2 (two) times daily.   denosumab 60 MG/ML Soln injection Commonly known as:  PROLIA Inject 60 mg into the skin every 6 (six) months. Administer in upper arm, thigh, or abdomen   EYLEA 2 MG/0.05ML Soln Generic drug:  Aflibercept 2 mg by Intravitreal route See admin instructions. Pt goes approximately every two months.   metroNIDAZOLE 0.75 % gel Commonly known as:  METROGEL Apply 1 application topically 2 (two) times daily. Pt applies to face.   omeprazole 20 MG capsule Commonly known as:  PRILOSEC Take 20 mg by mouth daily as needed (for acid reflux).   ondansetron 8 MG tablet Commonly known as:  ZOFRAN Take 8 mg by mouth every 8 (eight) hours as needed for nausea or vomiting.   ONE-A-DAY WOMENS PO Take 1 tablet daily by mouth.   rOPINIRole 0.5 MG tablet Commonly known as:  REQUIP Take 0.25 mg at bedtime by mouth.   simethicone 80 MG chewable tablet Commonly known as:  MYLICON Chew 80 mg by  mouth every 6 (six) hours as needed for flatulence.   telmisartan 80 MG tablet Commonly known as:  MICARDIS Take 80 mg by mouth daily.   vitamin C 500 MG tablet Commonly known as:  ASCORBIC ACID Take 500 mg daily by mouth.   Vitamin D3 2000 units Tabs Take 2,000 Units by mouth daily.      Follow-up Information    Merri Brunette, MD Follow up.   Specialty:  Internal  Medicine Contact information: [address] [place] [address] [place] [address] [phone]        [facility] up.   Specialty:  General Surgery Why:  as needed Contact information: [address] [place] [address] [phone]          No Known Allergies  Consultations:  General surgery  Procedures/Studies: Dg Abd 1 View  Result Date: 10/29/2017 CLINICAL DATA:  8 hour delayed film.  Small-bowel obstruction EXAM: ABDOMEN - 1 VIEW COMPARISON:  Plain film of the abdomen dated 10/28/2017 and CT abdomen dated 10/28/2017. FINDINGS: Enteric tube appears adequately positioned with tip directed towards the duodenal bulb. Distended gas-filled loops of small bowel are again appreciated within the right lower abdomen, similar to appearance on earlier CT, perhaps slightly improved in caliber compared to the plain film of 10/28/2017. Stool and gas noted within the left colon. No evidence of free intraperitoneal air seen. No acute or suspicious osseous finding. IMPRESSION: 1. Persistently distended gas-filled loops of small bowel within the right lower abdomen, consistent with small bowel obstruction as demonstrated on recent CT abdomen, perhaps slightly improved in caliber compared to the plain film of 10/28/2017. 2. Enteric tube appears well positioned in the stomach with tip directed towards the duodenal bulb. Electronically Signed   By: Bary Richard M.D.   On: 10/29/2017 09:13   Ct Abdomen Pelvis W Contrast  Result Date: 10/28/2017 CLINICAL DATA:  Abdomen pain EXAM: CT ABDOMEN AND PELVIS WITH CONTRAST TECHNIQUE: Multidetector CT imaging of the abdomen and pelvis was performed using the standard protocol following bolus administration of intravenous contrast. CONTRAST:  ISOVUE-300 IOPAMIDOL (ISOVUE-300) INJECTION 61% COMPARISON:  07/11/2017, CT 07/10/2017, 04/30/2008 FINDINGS: Lower chest: Lung bases demonstrate minimal bronchiectasis in the right  lower lobe. No acute consolidation or effusion. Heart size within normal limits Hepatobiliary: Stable small cyst in the right hepatic lobe. No calcified gallstones or biliary dilatation. Pancreas: Unremarkable. No pancreatic ductal dilatation or surrounding inflammatory changes. Spleen: Normal in size without focal abnormality. Adrenals/Urinary Tract: Adrenal glands are within normal limits. subcentimeter hypodense lesions within both kidneys too small to further characterize, probable cysts. Bladder within normal limits. Stomach/Bowel: Fluid-filled moderately enlarged stomach. Abnormal position of the duodenum jejunal junction which is seen to the right side of the spine. Multiple enlarged fluid-filled loops of proximal small bowel on the right side of the abdomen with transition white within the upper pelvis, slightly to the right of midline. Slight swirling configuration of the vessels in the region. Malrotated appearance of the bowel with small bowel on the right and colon on the left. The distal small bowel is decompressed. There is no colon wall thickening. There is diverticular disease of the colon. Vascular/Lymphatic: Moderate atherosclerotic vascular calcification. No aneurysmal dilatation. No abnormal adenopathy Reproductive: Status post hysterectomy. No adnexal masses. Other: Negative for free air. Small amount of free fluid in the pelvis. Musculoskeletal: No acute or suspicious bone lesion IMPRESSION: 1. Re- demonstrated malrotated configuration of the bowel. Moderate fluid-filled enlarged stomach with multiple  loops of enlarged fluid-filled proximal small bowel on the right side of the abdomen with transition point visualized in the upper pelvis slightly to the right of midline ; small bowel distal to this as well as the colon are decompressed. Findings are consistent with high-grade small bowel obstruction, this could be secondary to adhesions given prior surgical history, however slight swirling  appearance of the vasculature near the transition point raises possibility of obstruction secondary to a twist in the mesentery. 2. Small amount of free fluid in the pelvis 3. Subcentimeter hypodense lesions in the kidneys too small to further characterize but probably cysts. Electronically Signed   By: Jasmine PangKim  Fujinaga M.D.   On: 10/28/2017 18:24   Dg Abd 2 Views  Result Date: 10/30/2017 CLINICAL DATA:  Small bowel protocol 8 hour delay EXAM: ABDOMEN - 2 VIEW COMPARISON:  10/29/2017, 10/28/2017 FINDINGS: Esophageal tube tip is in the right upper quadrant. Contrast is present within the colon which is left-sided consistent with history of malrotation. There is some dilute contrast present within dilated small bowel in the right abdomen. IMPRESSION: Majority of the contrast is in the colon which is left-sided consistent with history of malrotation. Some dilute contrast is present within a dilated segment of small bowel in the right abdomen, overall the small bowel dilatation is decreased compared to prior radiograph. Electronically Signed   By: Jasmine PangKim  Fujinaga M.D.   On: 10/30/2017 02:55   Dg Abd Portable 1v  Result Date: 10/28/2017 CLINICAL DATA:  71 y/o  F; enteric tube placement. EXAM: PORTABLE ABDOMEN - 1 VIEW COMPARISON:  10/28/2017 abdomen CT FINDINGS: Enteric tube tip projects over gastric body. Contrast is present within the renal collecting systems. Dilated small bowel in right hemiabdomen as seen on CT. Bones are unremarkable. IMPRESSION: Enteric tube tip projects over gastric body. Dilated loops of small bowel in right hemiabdomen. Electronically Signed   By: Mitzi HansenLance  Furusawa-Stratton M.D.   On: 10/28/2017 19:18   Subjective: Feels well, ambulating, tolerated solid diet. Had single large constipated BM followed by diarrhea. No abd pain, N/V, and diarrhea has let up.   Discharge Exam: Vitals:   10/30/17 2109 10/31/17 0519  BP: 123/80 137/79  Pulse: 79 71  Resp: 16 16  Temp: 98.4 F (36.9 C)  98.4 F (36.9 C)  SpO2: 99% 99%   General: Pt is alert, awake, not in acute distress Cardiovascular: RRR, S1/S2 +, no rubs, no gallops Respiratory: CTA bilaterally, no wheezing, no rhonchi Abdominal: Soft, NT, ND, bowel sounds +. Small left paramedian subcutaneous nodule on abdominal wall.  Extremities: No edema, no cyanosis  Labs: Basic Metabolic Panel: Recent Labs  Lab 10/28/17 1342 10/29/17 0547 10/30/17 0538 10/31/17 0511  NA 135 138 143 141  K 3.9 3.4* 3.7 3.5  CL 90* 98* 110 109  CO2 32 30 28 26   GLUCOSE 138* 104* 111* 89  BUN 17 19 12 9   CREATININE 0.70 0.64 0.50 0.41*  CALCIUM 10.4* 9.0 8.2* 8.0*  MG  --   --  2.1 2.1   Liver Function Tests: Recent Labs  Lab 10/28/17 1342  AST 41  ALT 44  ALKPHOS 103  BILITOT 1.0  PROT 8.7*  ALBUMIN 5.1*   Recent Labs  Lab 10/28/17 1342  LIPASE 28   CBC: Recent Labs  Lab 10/28/17 1342 10/29/17 0547  WBC 13.1* 9.4  NEUTROABS  --  7.2  HGB 14.1 13.0  HCT 41.1 38.3  MCV 92.6 93.2  PLT 290 256   Urinalysis  Component Value Date/Time   COLORURINE YELLOW 10/28/2017 1734   APPEARANCEUR CLEAR 10/28/2017 1734   LABSPEC 1.019 10/28/2017 1734   PHURINE 6.0 10/28/2017 1734   GLUCOSEU NEGATIVE 10/28/2017 1734   HGBUR NEGATIVE 10/28/2017 1734   BILIRUBINUR NEGATIVE 10/28/2017 1734   KETONESUR 20 (A) 10/28/2017 1734   PROTEINUR 100 (A) 10/28/2017 1734   UROBILINOGEN 0.2 10/18/2008 0626   NITRITE NEGATIVE 10/28/2017 1734   LEUKOCYTESUR NEGATIVE 10/28/2017 1734    Time coordinating discharge: Approximately 40 minutes  Hazeline Junkeryan Judge Duque, MD  [facility] 10/31/2017, 1:22 PM Pager [phone]

## 2017-12-03 DIAGNOSIS — H35042 Retinal micro-aneurysms, unspecified, left eye: Secondary | ICD-10-CM | POA: Diagnosis not present

## 2017-12-03 DIAGNOSIS — H359 Unspecified retinal disorder: Secondary | ICD-10-CM | POA: Diagnosis not present

## 2017-12-03 DIAGNOSIS — H34832 Tributary (branch) retinal vein occlusion, left eye, with macular edema: Secondary | ICD-10-CM | POA: Diagnosis not present

## 2017-12-03 DIAGNOSIS — H35352 Cystoid macular degeneration, left eye: Secondary | ICD-10-CM | POA: Diagnosis not present

## 2018-01-28 DIAGNOSIS — H2511 Age-related nuclear cataract, right eye: Secondary | ICD-10-CM | POA: Diagnosis not present

## 2018-01-28 DIAGNOSIS — H34832 Tributary (branch) retinal vein occlusion, left eye, with macular edema: Secondary | ICD-10-CM | POA: Diagnosis not present

## 2018-01-28 DIAGNOSIS — H2512 Age-related nuclear cataract, left eye: Secondary | ICD-10-CM | POA: Diagnosis not present

## 2018-01-28 DIAGNOSIS — H35042 Retinal micro-aneurysms, unspecified, left eye: Secondary | ICD-10-CM | POA: Diagnosis not present

## 2018-01-28 DIAGNOSIS — H35352 Cystoid macular degeneration, left eye: Secondary | ICD-10-CM | POA: Diagnosis not present

## 2018-03-01 IMAGING — CT CT ABD-PELV W/ CM
2 of 5 series · 16 of 46 positions shown, 18 images · IV contrast (ISOVUE)
Comparison: Plain film from earlier in the same day.

CLINICAL DATA: Abdominal discomfort with findings of small-bowel
obstruction on recent plain film examination.

EXAM:
CT ABDOMEN AND PELVIS WITH CONTRAST
TECHNIQUE: Multidetector CT imaging of the abdomen and pelvis was performed
using the standard protocol following bolus administration of
intravenous contrast.
CONTRAST:  100mL 9S6T9K-VLL IOPAMIDOL (9S6T9K-VLL) INJECTION 61%,
30mL 9S6T9K-VLL IOPAMIDOL (9S6T9K-VLL) INJECTION 61%

[Series 2: abd/pel with · axial · 0.68mm/px · z∈[-427,-52]mm · 13 of 87 slices shown, 15 images]
[im 6/87  soft-tissue]
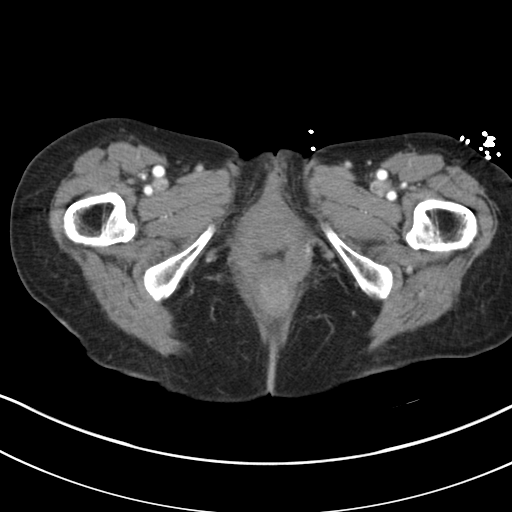
[im 6/87  bone]
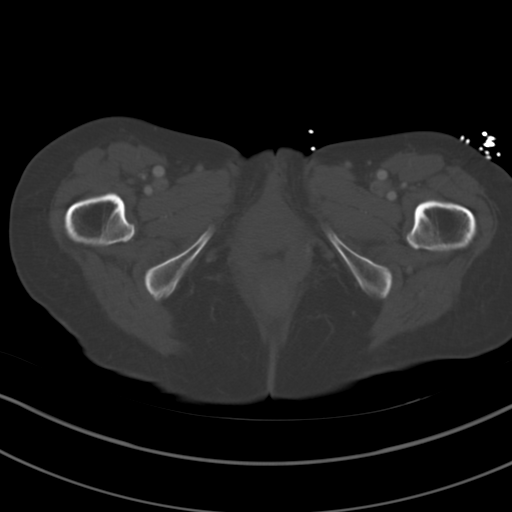
[im 12/87  soft-tissue]
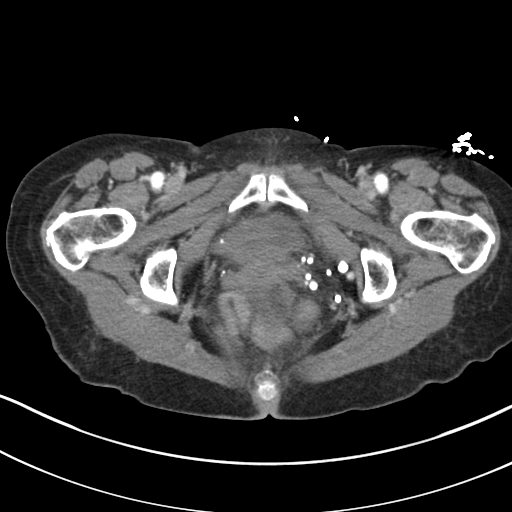
[im 18/87  soft-tissue]
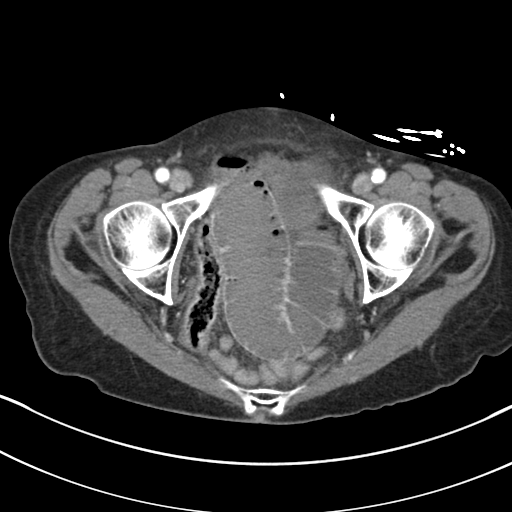
[im 23/87  soft-tissue]
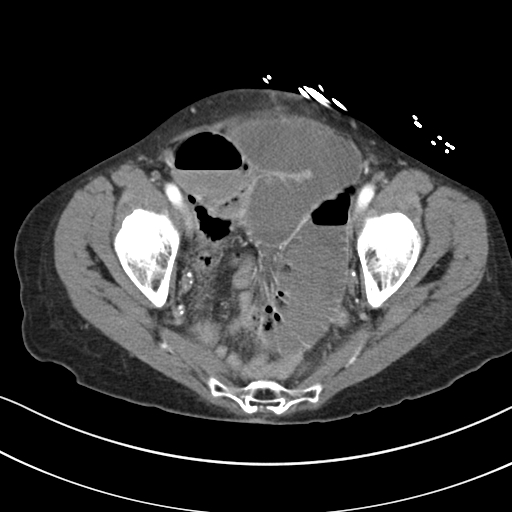
[im 29/87  soft-tissue]
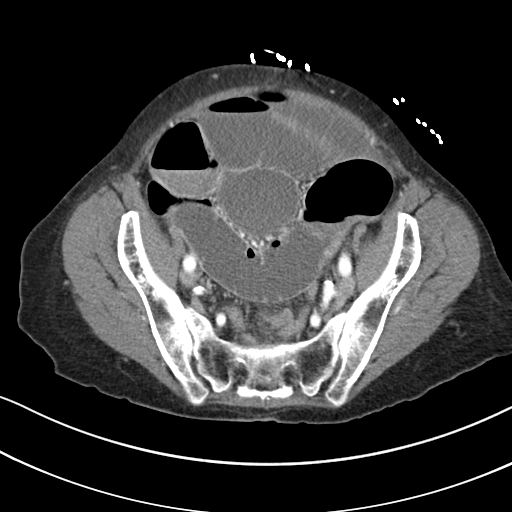
[im 35/87  soft-tissue]
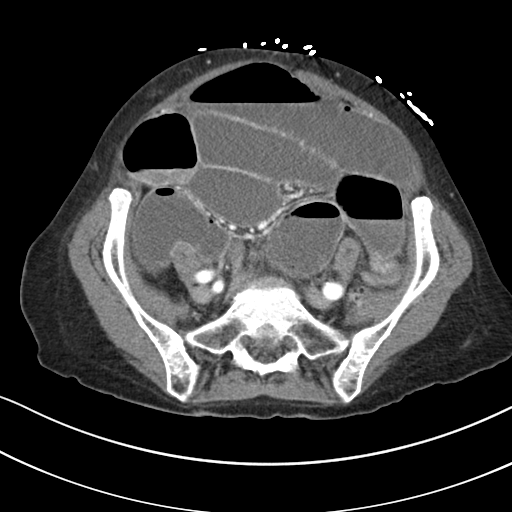
[im 46/87  soft-tissue]
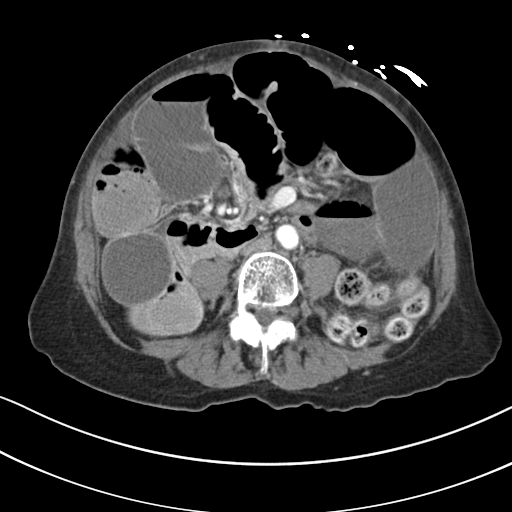
[im 52/87  soft-tissue]
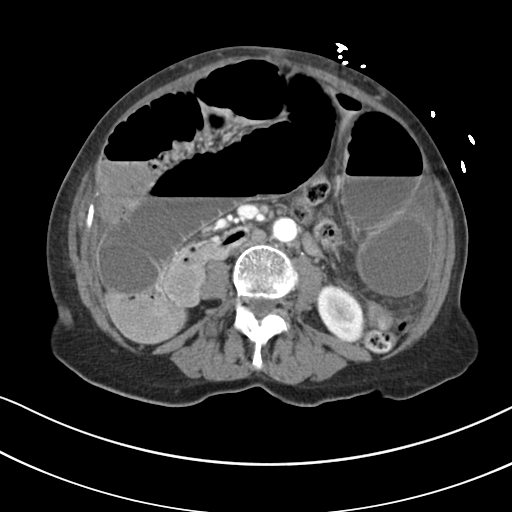
[im 58/87  soft-tissue]
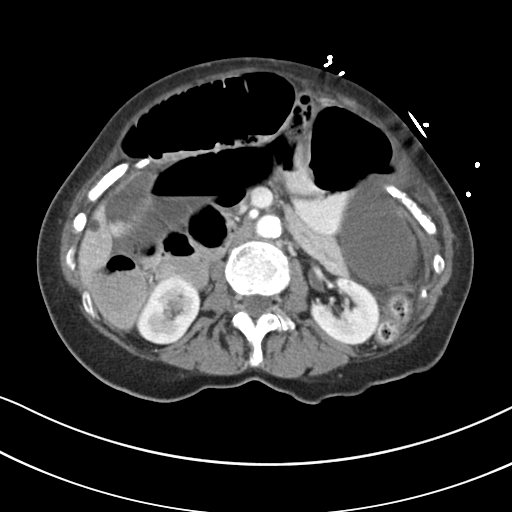
[im 58/87  bone]
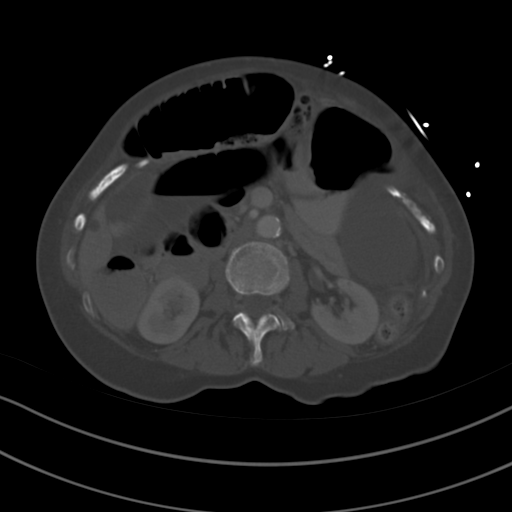
[im 64/87  soft-tissue]
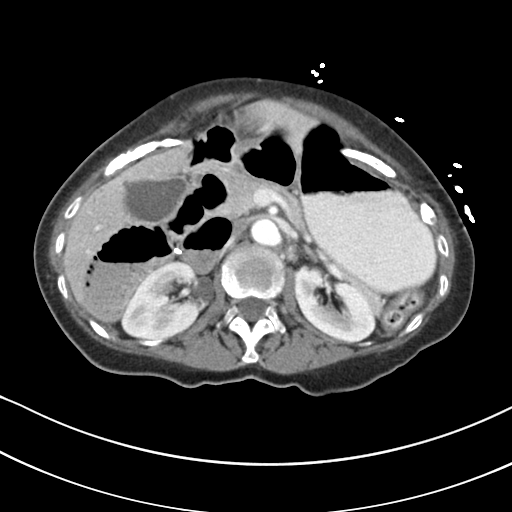
[im 69/87  soft-tissue]
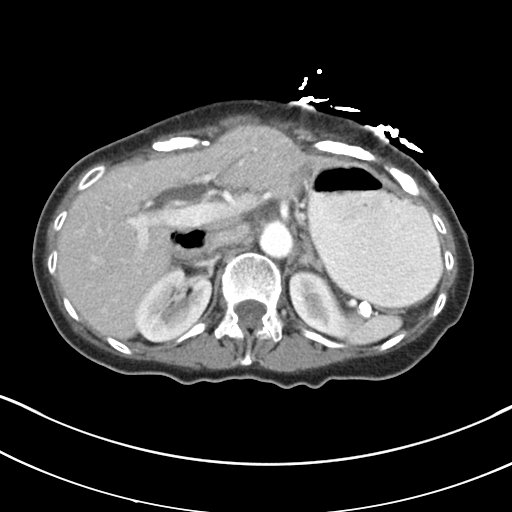
[im 75/87  soft-tissue]
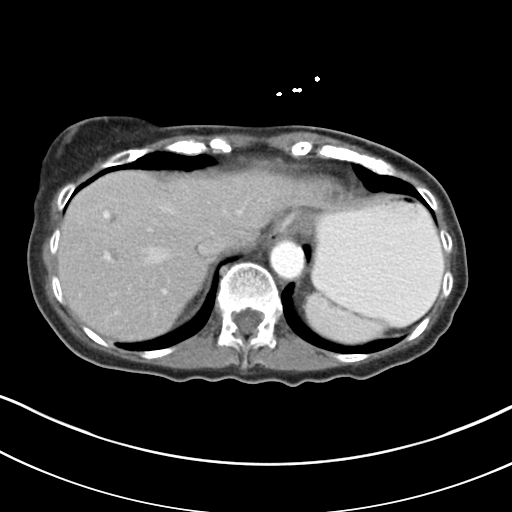
[im 81/87  soft-tissue]
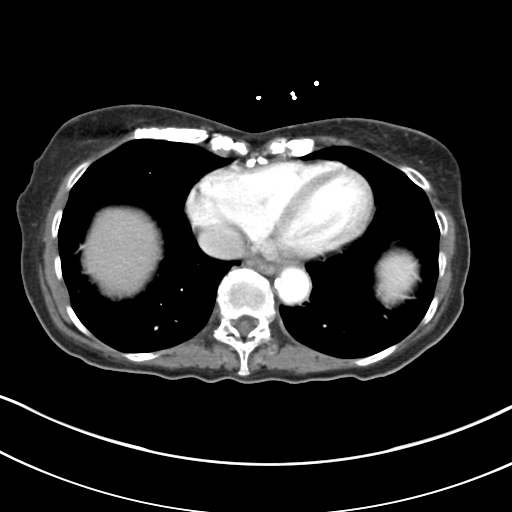

[Series 4: coronal a/|p · coronal · 0.79mm/px · 3 of 167 slices shown]
[im 56/167  soft-tissue]
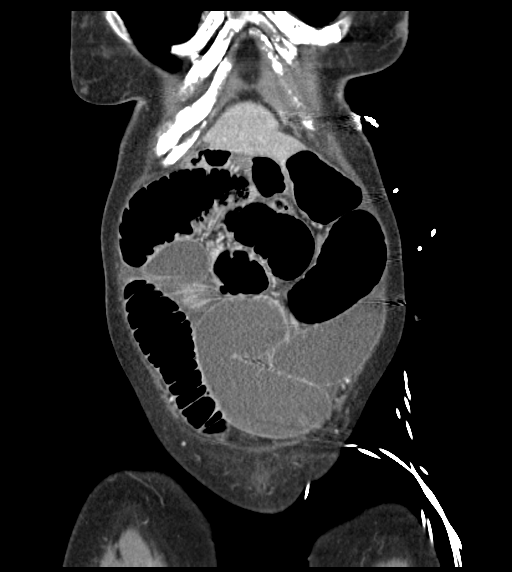
[im 74/167  soft-tissue]
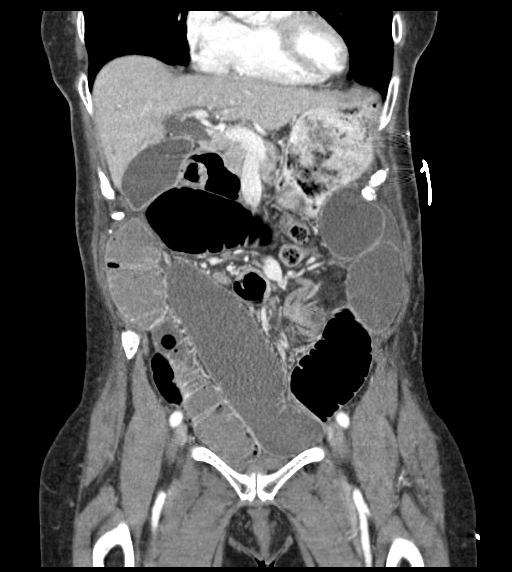
[im 93/167  soft-tissue]
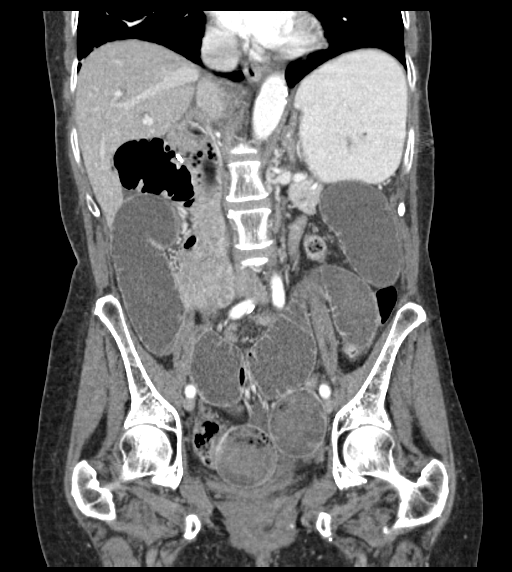

[16 of 46 positions shown; findings below may reference images not displayed]

FINDINGS: Lower chest: No acute abnormality.

Hepatobiliary: Mild decreased attenuation is noted within the liver
consistent with fatty infiltration. Some small dependent gallstones
are noted. A small hepatic cyst is noted in the tip of the right
lobe.

Pancreas: Unremarkable. No pancreatic ductal dilatation or
surrounding inflammatory changes.

Spleen: Normal in size without focal abnormality.

Adrenals/Urinary Tract: The adrenal glands are within normal limits.
Kidneys demonstrate a normal enhancement pattern bilaterally. No
renal calculi or obstructive changes are seen. The bladder is
decompressed.

Stomach/Bowel: There is significant small bowel dilatation
identified. A transition zone is noted in the mid abdomen best seen
on image she is 46 and 47 of series to as well as image number 76 of
series 4. Given the postsurgical changes in the abdomen this likely
represents adhesions. No definitive mass lesion is identified. The
colon is decompressed. No free fluid or free air is identified.

Vascular/Lymphatic: Aortic atherosclerosis. No enlarged abdominal or
pelvic lymph nodes.

Reproductive: Status post hysterectomy. No adnexal masses.

Other: No abdominal wall hernia or abnormality. No abdominopelvic
ascites.

Musculoskeletal: Degenerative changes of lumbar spine are noted.
IMPRESSION: High-grade small bowel obstruction with a transition point in the
mid abdomen as described likely related to postoperative Adhesions.

Small gallstones without complicating factors.

Chronic changes as described above without other acute abnormality.

## 2018-04-06 DIAGNOSIS — H40013 Open angle with borderline findings, low risk, bilateral: Secondary | ICD-10-CM | POA: Diagnosis not present

## 2018-04-06 DIAGNOSIS — H01009 Unspecified blepharitis unspecified eye, unspecified eyelid: Secondary | ICD-10-CM | POA: Diagnosis not present

## 2018-04-06 DIAGNOSIS — L718 Other rosacea: Secondary | ICD-10-CM | POA: Diagnosis not present

## 2018-04-06 DIAGNOSIS — H04123 Dry eye syndrome of bilateral lacrimal glands: Secondary | ICD-10-CM | POA: Diagnosis not present

## 2018-04-09 DIAGNOSIS — M810 Age-related osteoporosis without current pathological fracture: Secondary | ICD-10-CM | POA: Diagnosis not present

## 2018-04-27 DIAGNOSIS — H34832 Tributary (branch) retinal vein occlusion, left eye, with macular edema: Secondary | ICD-10-CM | POA: Diagnosis not present

## 2018-04-27 DIAGNOSIS — H2511 Age-related nuclear cataract, right eye: Secondary | ICD-10-CM | POA: Diagnosis not present

## 2018-04-27 DIAGNOSIS — H35042 Retinal micro-aneurysms, unspecified, left eye: Secondary | ICD-10-CM | POA: Diagnosis not present

## 2018-04-27 DIAGNOSIS — H35352 Cystoid macular degeneration, left eye: Secondary | ICD-10-CM | POA: Diagnosis not present

## 2018-06-20 IMAGING — DX DG ABDOMEN 1V
1 series · 1 of 1 positions shown · non-contrast
Comparison: Plain film of the abdomen dated 10/28/2017 and CT
abdomen dated 10/28/2017.

CLINICAL DATA: 8 hour delayed film.  Small-bowel obstruction

EXAM:
ABDOMEN - 1 VIEW

[abdomen kub]
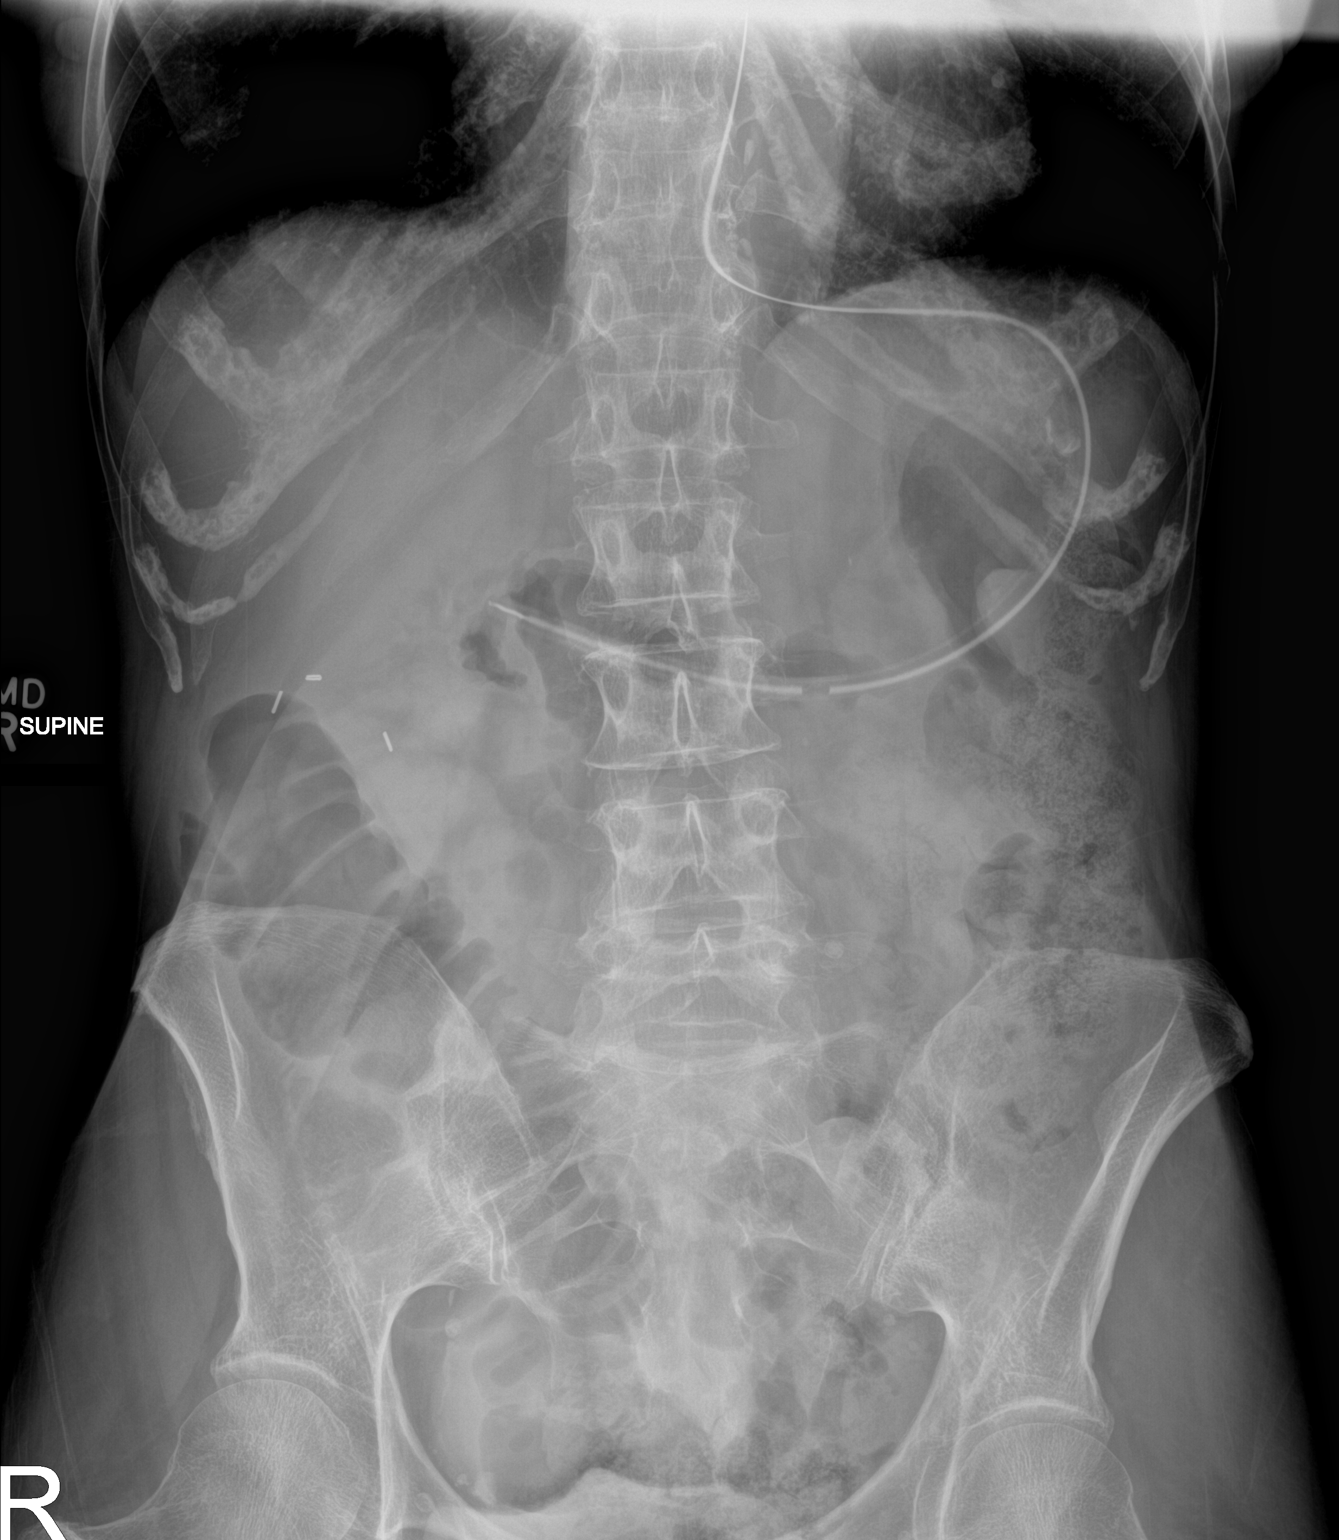

[1 of 1 positions shown; findings below may reference images not displayed]

FINDINGS: Enteric tube appears adequately positioned with tip directed towards
the duodenal bulb. Distended gas-filled loops of small bowel are
again appreciated within the right lower abdomen, similar to
appearance on earlier CT, perhaps slightly improved in caliber
compared to the plain film of 10/28/2017. Stool and gas noted within
the left colon.

No evidence of free intraperitoneal air seen. No acute or suspicious
osseous finding.
IMPRESSION: 1. Persistently distended gas-filled loops of small bowel within the
right lower abdomen, consistent with small bowel obstruction as
demonstrated on recent CT abdomen, perhaps slightly improved in
caliber compared to the plain film of 10/28/2017.
2. Enteric tube appears well positioned in the stomach with tip
directed towards the duodenal bulb.

## 2018-09-08 DIAGNOSIS — I10 Essential (primary) hypertension: Secondary | ICD-10-CM | POA: Diagnosis not present

## 2018-09-08 DIAGNOSIS — E7800 Pure hypercholesterolemia, unspecified: Secondary | ICD-10-CM | POA: Diagnosis not present

## 2018-09-08 DIAGNOSIS — M810 Age-related osteoporosis without current pathological fracture: Secondary | ICD-10-CM | POA: Diagnosis not present

## 2018-09-15 DIAGNOSIS — I8390 Asymptomatic varicose veins of unspecified lower extremity: Secondary | ICD-10-CM | POA: Diagnosis not present

## 2018-09-15 DIAGNOSIS — G2581 Restless legs syndrome: Secondary | ICD-10-CM | POA: Diagnosis not present

## 2018-09-15 DIAGNOSIS — L719 Rosacea, unspecified: Secondary | ICD-10-CM | POA: Diagnosis not present

## 2018-09-15 DIAGNOSIS — E7800 Pure hypercholesterolemia, unspecified: Secondary | ICD-10-CM | POA: Diagnosis not present

## 2018-09-15 DIAGNOSIS — R636 Underweight: Secondary | ICD-10-CM | POA: Diagnosis not present

## 2018-09-15 DIAGNOSIS — I7300 Raynaud's syndrome without gangrene: Secondary | ICD-10-CM | POA: Diagnosis not present

## 2018-09-15 DIAGNOSIS — I10 Essential (primary) hypertension: Secondary | ICD-10-CM | POA: Diagnosis not present

## 2018-09-15 DIAGNOSIS — D649 Anemia, unspecified: Secondary | ICD-10-CM | POA: Diagnosis not present

## 2018-09-15 DIAGNOSIS — M8580 Other specified disorders of bone density and structure, unspecified site: Secondary | ICD-10-CM | POA: Diagnosis not present

## 2018-09-15 DIAGNOSIS — Z0000 Encounter for general adult medical examination without abnormal findings: Secondary | ICD-10-CM | POA: Diagnosis not present

## 2018-09-28 DIAGNOSIS — M810 Age-related osteoporosis without current pathological fracture: Secondary | ICD-10-CM | POA: Diagnosis not present

## 2018-10-01 DIAGNOSIS — M810 Age-related osteoporosis without current pathological fracture: Secondary | ICD-10-CM | POA: Diagnosis not present

## 2018-10-07 DIAGNOSIS — Z01419 Encounter for gynecological examination (general) (routine) without abnormal findings: Secondary | ICD-10-CM | POA: Diagnosis not present

## 2018-10-07 DIAGNOSIS — Z1212 Encounter for screening for malignant neoplasm of rectum: Secondary | ICD-10-CM | POA: Diagnosis not present

## 2018-10-09 DIAGNOSIS — M810 Age-related osteoporosis without current pathological fracture: Secondary | ICD-10-CM | POA: Diagnosis not present

## 2018-10-12 DIAGNOSIS — H34822 Venous engorgement, left eye: Secondary | ICD-10-CM | POA: Diagnosis not present

## 2018-10-12 DIAGNOSIS — H35042 Retinal micro-aneurysms, unspecified, left eye: Secondary | ICD-10-CM | POA: Diagnosis not present

## 2018-10-12 DIAGNOSIS — H35352 Cystoid macular degeneration, left eye: Secondary | ICD-10-CM | POA: Diagnosis not present

## 2018-10-12 DIAGNOSIS — H34832 Tributary (branch) retinal vein occlusion, left eye, with macular edema: Secondary | ICD-10-CM | POA: Diagnosis not present

## 2018-10-19 DIAGNOSIS — H34832 Tributary (branch) retinal vein occlusion, left eye, with macular edema: Secondary | ICD-10-CM | POA: Diagnosis not present

## 2018-11-30 DIAGNOSIS — H34832 Tributary (branch) retinal vein occlusion, left eye, with macular edema: Secondary | ICD-10-CM | POA: Diagnosis not present

## 2018-11-30 DIAGNOSIS — H35352 Cystoid macular degeneration, left eye: Secondary | ICD-10-CM | POA: Diagnosis not present

## 2018-11-30 DIAGNOSIS — H2511 Age-related nuclear cataract, right eye: Secondary | ICD-10-CM | POA: Diagnosis not present

## 2018-11-30 DIAGNOSIS — H35042 Retinal micro-aneurysms, unspecified, left eye: Secondary | ICD-10-CM | POA: Diagnosis not present

## 2019-01-25 DIAGNOSIS — H34832 Tributary (branch) retinal vein occlusion, left eye, with macular edema: Secondary | ICD-10-CM | POA: Diagnosis not present

## 2019-01-25 DIAGNOSIS — H3562 Retinal hemorrhage, left eye: Secondary | ICD-10-CM | POA: Diagnosis not present

## 2019-01-25 DIAGNOSIS — H35042 Retinal micro-aneurysms, unspecified, left eye: Secondary | ICD-10-CM | POA: Diagnosis not present

## 2019-01-25 DIAGNOSIS — H35352 Cystoid macular degeneration, left eye: Secondary | ICD-10-CM | POA: Diagnosis not present

## 2019-03-18 DIAGNOSIS — H34832 Tributary (branch) retinal vein occlusion, left eye, with macular edema: Secondary | ICD-10-CM | POA: Diagnosis not present

## 2019-03-18 DIAGNOSIS — H2512 Age-related nuclear cataract, left eye: Secondary | ICD-10-CM | POA: Diagnosis not present

## 2019-05-12 DIAGNOSIS — H35352 Cystoid macular degeneration, left eye: Secondary | ICD-10-CM | POA: Diagnosis not present

## 2019-05-12 DIAGNOSIS — H35042 Retinal micro-aneurysms, unspecified, left eye: Secondary | ICD-10-CM | POA: Diagnosis not present

## 2019-05-12 DIAGNOSIS — H34832 Tributary (branch) retinal vein occlusion, left eye, with macular edema: Secondary | ICD-10-CM | POA: Diagnosis not present

## 2019-06-16 DIAGNOSIS — M810 Age-related osteoporosis without current pathological fracture: Secondary | ICD-10-CM | POA: Diagnosis not present

## 2019-07-26 DIAGNOSIS — H35352 Cystoid macular degeneration, left eye: Secondary | ICD-10-CM | POA: Diagnosis not present

## 2019-07-26 DIAGNOSIS — H2511 Age-related nuclear cataract, right eye: Secondary | ICD-10-CM | POA: Diagnosis not present

## 2019-07-26 DIAGNOSIS — H34832 Tributary (branch) retinal vein occlusion, left eye, with macular edema: Secondary | ICD-10-CM | POA: Diagnosis not present

## 2019-07-26 DIAGNOSIS — H35042 Retinal micro-aneurysms, unspecified, left eye: Secondary | ICD-10-CM | POA: Diagnosis not present

## 2019-08-24 DIAGNOSIS — H34832 Tributary (branch) retinal vein occlusion, left eye, with macular edema: Secondary | ICD-10-CM | POA: Diagnosis not present

## 2019-08-24 DIAGNOSIS — H35032 Hypertensive retinopathy, left eye: Secondary | ICD-10-CM | POA: Diagnosis not present

## 2019-08-24 DIAGNOSIS — H35042 Retinal micro-aneurysms, unspecified, left eye: Secondary | ICD-10-CM | POA: Diagnosis not present

## 2019-08-24 DIAGNOSIS — H35352 Cystoid macular degeneration, left eye: Secondary | ICD-10-CM | POA: Diagnosis not present

## 2019-09-17 DIAGNOSIS — E7800 Pure hypercholesterolemia, unspecified: Secondary | ICD-10-CM | POA: Diagnosis not present

## 2019-09-17 DIAGNOSIS — M810 Age-related osteoporosis without current pathological fracture: Secondary | ICD-10-CM | POA: Diagnosis not present

## 2019-09-17 DIAGNOSIS — I10 Essential (primary) hypertension: Secondary | ICD-10-CM | POA: Diagnosis not present

## 2019-09-17 DIAGNOSIS — E559 Vitamin D deficiency, unspecified: Secondary | ICD-10-CM | POA: Diagnosis not present

## 2019-09-17 DIAGNOSIS — Z1159 Encounter for screening for other viral diseases: Secondary | ICD-10-CM | POA: Diagnosis not present

## 2019-09-22 DIAGNOSIS — Z1159 Encounter for screening for other viral diseases: Secondary | ICD-10-CM | POA: Diagnosis not present

## 2019-09-22 DIAGNOSIS — I7300 Raynaud's syndrome without gangrene: Secondary | ICD-10-CM | POA: Diagnosis not present

## 2019-09-22 DIAGNOSIS — G2581 Restless legs syndrome: Secondary | ICD-10-CM | POA: Diagnosis not present

## 2019-09-22 DIAGNOSIS — M8580 Other specified disorders of bone density and structure, unspecified site: Secondary | ICD-10-CM | POA: Diagnosis not present

## 2019-09-22 DIAGNOSIS — Z1211 Encounter for screening for malignant neoplasm of colon: Secondary | ICD-10-CM | POA: Diagnosis not present

## 2019-09-22 DIAGNOSIS — M810 Age-related osteoporosis without current pathological fracture: Secondary | ICD-10-CM | POA: Diagnosis not present

## 2019-09-22 DIAGNOSIS — I493 Ventricular premature depolarization: Secondary | ICD-10-CM | POA: Diagnosis not present

## 2019-09-22 DIAGNOSIS — I10 Essential (primary) hypertension: Secondary | ICD-10-CM | POA: Diagnosis not present

## 2019-09-22 DIAGNOSIS — Z0000 Encounter for general adult medical examination without abnormal findings: Secondary | ICD-10-CM | POA: Diagnosis not present

## 2019-09-22 DIAGNOSIS — E7800 Pure hypercholesterolemia, unspecified: Secondary | ICD-10-CM | POA: Diagnosis not present

## 2019-09-22 DIAGNOSIS — R636 Underweight: Secondary | ICD-10-CM | POA: Diagnosis not present

## 2019-09-29 DIAGNOSIS — Z01419 Encounter for gynecological examination (general) (routine) without abnormal findings: Secondary | ICD-10-CM | POA: Diagnosis not present

## 2019-09-29 DIAGNOSIS — Z1212 Encounter for screening for malignant neoplasm of rectum: Secondary | ICD-10-CM | POA: Diagnosis not present

## 2019-10-24 DIAGNOSIS — I493 Ventricular premature depolarization: Secondary | ICD-10-CM | POA: Diagnosis not present

## 2019-10-25 DIAGNOSIS — H35032 Hypertensive retinopathy, left eye: Secondary | ICD-10-CM | POA: Diagnosis not present

## 2019-10-25 DIAGNOSIS — H35352 Cystoid macular degeneration, left eye: Secondary | ICD-10-CM | POA: Diagnosis not present

## 2019-10-25 DIAGNOSIS — H34832 Tributary (branch) retinal vein occlusion, left eye, with macular edema: Secondary | ICD-10-CM | POA: Diagnosis not present

## 2019-10-25 DIAGNOSIS — H35042 Retinal micro-aneurysms, unspecified, left eye: Secondary | ICD-10-CM | POA: Diagnosis not present

## 2019-11-08 ENCOUNTER — Other Ambulatory Visit: Payer: Self-pay

## 2019-11-08 ENCOUNTER — Encounter: Payer: Self-pay | Admitting: Cardiovascular Disease

## 2019-11-08 ENCOUNTER — Ambulatory Visit: Payer: Medicare HMO | Admitting: Cardiovascular Disease

## 2019-11-08 DIAGNOSIS — I340 Nonrheumatic mitral (valve) insufficiency: Secondary | ICD-10-CM

## 2019-11-08 DIAGNOSIS — I341 Nonrheumatic mitral (valve) prolapse: Secondary | ICD-10-CM

## 2019-11-08 DIAGNOSIS — I471 Supraventricular tachycardia, unspecified: Secondary | ICD-10-CM

## 2019-11-08 DIAGNOSIS — I493 Ventricular premature depolarization: Secondary | ICD-10-CM

## 2019-11-08 DIAGNOSIS — I10 Essential (primary) hypertension: Secondary | ICD-10-CM | POA: Diagnosis not present

## 2019-11-08 DIAGNOSIS — R011 Cardiac murmur, unspecified: Secondary | ICD-10-CM | POA: Diagnosis not present

## 2019-11-08 DIAGNOSIS — Z79899 Other long term (current) drug therapy: Secondary | ICD-10-CM | POA: Diagnosis not present

## 2019-11-08 DIAGNOSIS — R002 Palpitations: Secondary | ICD-10-CM | POA: Diagnosis not present

## 2019-11-08 MED ORDER — METOPROLOL SUCCINATE ER 25 MG PO TB24: 25.0000 mg | ORAL_TABLET | Freq: Every day | ORAL | 3 refills | Status: DC

## 2019-11-08 NOTE — Patient Instructions (Signed)
Medication Instructions:  START METOPROLOL 25MG  DAILY If you need a refill on your cardiac medications before your next appointment, please call your pharmacy.  Labwork: TSH,MAG AND BMET TODAY HERE IN OUR OFFICE AT [facility]    If you have labs (blood work) drawn today and your tests are completely normal, you will receive your results only by: Marland Kitchen MyChart Message (if you have MyChart) OR . A paper copy in the mail If you have any lab test that is abnormal or we need to change your treatment, we will call you to review the results.  Testing/Procedures: Echocardiogram - Your physician has requested that you have an echocardiogram. Echocardiography is a painless test that uses sound waves to create images of your heart. It provides your doctor with information about the size and shape of your heart and how well your heart's chambers and valves are working. This procedure takes approximately one hour. There are no restrictions for this procedure. This will be performed at our [facility] location - [address].  Follow-Up: IN 6 WEEKS AFTER ECHO In Person Shelva Majestic, MD.    At [facility], you and your health needs are our priority.  As part of our continuing mission to provide you with exceptional heart care, we have created designated Provider Care Teams.  These Care Teams include your primary Cardiologist (physician) and Advanced Practice Providers (APPs -  Physician Assistants and Nurse Practitioners) who all work together to provide you with the care you need, when you need it.  Thank you for choosing [facility] at [facility]!!

## 2019-11-08 NOTE — Progress Notes (Signed)
Cardiology Office Note    Date:  11/12/2019   ID:  [name], DOB [birth year], MRN [id number]  PCP:  Allison Pretty, MD  Cardiologist:  Allison Majestic, MD   New cardiology evaluation referred by [name] in follow-up of a Zio patch monitor  History of Present Illness:  [name] is a 73 y.o. female who is the wife of Dr. Marveen Dorsey, a retired rheumatologist.  [name] is followed by Dr. Shelia Media and was recently evaluated with symptoms of increasing palpitations.  She has a history of Raynaud's syndrome, hypertension on Micardis, restless leg syndrome, and osteoporosis.  Because of episodes of palpitations, she had worn a Zio patch from September 30 through September 29, 2019 and was monitored for 6 days and 21 hours.  The predominant rhythm was sinus rhythm with an average rate at 77 bpm.  Her minimum heart rate was sinus bradycardia at 49 bpm and maximum heart rate was 162 bpm.  She was found to have 2 episodes of supraventricular tachycardia with the fastest interval lasting 11 beats with a maximum rate at 162 bpm.  There were rare isolated PACs and couplets.  Isolated PVCs were occasional at 4.1% rare ventricular couplet without triplets.  There also were episodes of ventricular bigeminy and trigeminy.  He saw [name] in follow-up is now referred for cardiology evaluation.  She has a history hypertension for at least 15 years.  In addition she has a history of retinal branch vein occlusion of her left eye.  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  She denies any chest pain.  She denies any presyncope or syncope.  She presents for evaluation.  Past Medical History:  Diagnosis Date  . Hypertension   . Macular edema   . Osteoporosis     Past Surgical History:  Procedure Laterality Date  . ABDOMINAL ADHESION SURGERY  2009   [name].  Malrotation - LOA done  . ABDOMINAL HYSTERECTOMY    . APPENDECTOMY    . LAPAROTOMY N/A  07/13/2017   Procedure: EXPLORATORY LAPAROTOMY, ENTEROLYSIS;  Surgeon: Allison Hausen, MD;  Location: [facility];  Service: General;  Laterality: N/A;    Current Medications: Outpatient Medications Prior to Visit  Medication Sig Dispense Refill  . amLODipine (NORVASC) 5 MG tablet Take 2.5 mg by mouth daily.    . cetirizine (ZYRTEC) 10 MG tablet Take 10 mg daily by mouth.    . Cholecalciferol (VITAMIN D3) 2000 units TABS Take 2,000 Units by mouth daily.    . cycloSPORINE (RESTASIS) 0.05 % ophthalmic emulsion Place 1 drop into both eyes 2 (two) times daily.    Marland Kitchen denosumab (PROLIA) 60 MG/ML SOLN injection Inject 60 mg into the skin every 6 (six) months. Administer in upper arm, thigh, or abdomen    . metroNIDAZOLE (METROGEL) 0.75 % gel Apply 1 application topically 2 (two) times daily. Pt applies to face.    . Multiple Vitamins-Calcium (ONE-A-DAY WOMENS PO) Take 1 tablet daily by mouth.    Marland Kitchen rOPINIRole (REQUIP) 0.5 MG tablet Take 0.25 mg at bedtime by mouth.  4  . telmisartan (MICARDIS) 80 MG tablet Take 80 mg by mouth daily.    . Aflibercept (EYLEA) 2 MG/0.05ML SOLN 2 mg by Intravitreal route See admin instructions. Pt goes approximately every two months.    Marland Kitchen omeprazole (PRILOSEC) 20 MG capsule Take 20 mg by mouth daily as needed (for acid reflux).    . ondansetron (ZOFRAN) 8 MG  tablet Take 8 mg by mouth every 8 (eight) hours as needed for nausea or vomiting.    . simethicone (MYLICON) 80 MG chewable tablet Chew 80 mg by mouth every 6 (six) hours as needed for flatulence.    . vitamin C (ASCORBIC ACID) 500 MG tablet Take 500 mg daily by mouth.     No facility-administered medications prior to visit.      Allergies:   Patient has no known allergies.   Social History   Socioeconomic History  . Marital status: Married    Spouse name: Not on file  . Number of children: Not on file  . Years of education: Not on file  . Highest education level: Not on file  Occupational History  . Not on  file  Social Needs  . Financial resource strain: Not on file  . Food insecurity    Worry: Not on file    Inability: Not on file  . Transportation needs    Medical: Not on file    Non-medical: Not on file  Tobacco Use  . Smoking status: Never Smoker  . Smokeless tobacco: Never Used  Substance and Sexual Activity  . Alcohol use: Yes    Alcohol/week: 1.0 standard drinks    Types: 1 Glasses of wine per week    Comment: daily  . Drug use: No  . Sexual activity: Never  Lifestyle  . Physical activity    Days per week: Not on file    Minutes per session: Not on file  . Stress: Not on file  Relationships  . Social Herbalist on phone: Not on file    Gets together: Not on file    Attends religious service: Not on file    Active member of club or organization: Not on file    Attends meetings of clubs or organizations: Not on file    Relationship status: Not on file  Other Topics Concern  . Not on file  Social History Narrative  . Not on file    Socially she is married to Dr. Marveen Dorsey for 50 years.  She has 4 children and 2 grandchildren.  She has a Scientist, water quality.  She drinks occasional red wine.  She walks for approximately half hour 4 days/week.  There is no tobacco history.  Family History:  The patient's family history includes Hypertension in her mother; Osteoarthritis in her mother; Stroke in her mother.   Parents are deceased.  Her mother died at age 45 and had a history of atrial fibrillation, stroke and congestive heart failure.  Her father died at age 59 and had a subarachnoid hemorrhage and died suddenly.  ROS General: Negative; No fevers, chills, or night sweats;  HEENT: History of retinal branch vein occlusion of her left eye; No changes in vision or hearing, sinus congestion, difficulty swallowing Pulmonary: Negative; No cough, wheezing, shortness of breath, hemoptysis Cardiovascular: Negative; No chest pain, presyncope, syncope, palpitations GI:  Congenital malrotation of her intestines GU: Negative; No dysuria, hematuria, or difficulty voiding Musculoskeletal: Negative; no myalgias, joint pain, or weakness Hematologic/Oncology: Negative; no easy bruising, bleeding Endocrine: Negative; no heat/cold intolerance; no diabetes Neuro: History of rosacea Psychiatric: Negative; No behavioral problems, depression Sleep: Negative; No snoring, daytime sleepiness, hypersomnolence, bruxism, restless legs, hypnogognic hallucinations, no cataplexy Other comprehensive 14 point system review is negative.   PHYSICAL EXAM:   VS:  BP (!) 185/102   Pulse (!) 104   Temp (!) 97.3 F (36.3 C)  Ht 5' 6"  (1.676 m)   Wt 111 lb (50.3 kg)   BMI 17.92 kg/m     Repeat blood pressure by me was elevated at 175/88.  Wt Readings from Last 3 Encounters:  11/08/19 111 lb (50.3 kg)  10/30/17 108 lb 3 oz (49.1 kg)  07/10/17 111 lb (50.3 kg)    General: Alert, oriented, no distress.  Skin: normal turgor, no rashes, warm and dry HEENT: Normocephalic, atraumatic. Pupils equal round and reactive to light; sclera anicteric; extraocular muscles intact;  Nose without nasal septal hypertrophy Mouth/Parynx benign; Mallinpatti scale 2 Neck: No JVD, no carotid bruits; normal carotid upstroke Lungs: clear to ausculatation and percussion; no wheezing or rales Chest wall: without tenderness to palpitation Heart: PMI not displaced, RRR, s1 s2 normal, multiple systolic clicks with a 2/6 murmur suggestive of mitral regurgitation, no diastolic murmur, no rubs, gallops, thrills, or heaves Abdomen: soft, nontender; no hepatosplenomehaly, BS+; abdominal aorta nontender and not dilated by palpation. Back: no CVA tenderness Pulses 2+ Musculoskeletal: full range of motion, normal strength, no joint deformities Extremities: no clubbing cyanosis or edema, Homan's sign negative  Neurologic: grossly nonfocal; Cranial nerves grossly wnl Psychologic: Normal mood and affect    Studies/Labs Reviewed:   EKG:  EKG is ordered today.  ECG (independently read by me): Sinus tachycardia at 104 with frequent PVCs and PAC.  Recent Labs: BMP Latest Ref Rng & Units 11/08/2019 10/31/2017 10/30/2017  Glucose 65 - 99 mg/dL 107(H) 89 111(H)  BUN 8 - 27 mg/dL 15 9 12   Creatinine 0.57 - 1.00 mg/dL 0.61 0.41(L) 0.50  BUN/Creat Ratio 12 - 28 25 - -  Sodium 134 - 144 mmol/L 136 141 143  Potassium 3.5 - 5.2 mmol/L 4.5 3.5 3.7  Chloride 96 - 106 mmol/L 94(L) 109 110  CO2 20 - 29 mmol/L 24 26 28   Calcium 8.7 - 10.3 mg/dL 9.6 8.0(L) 8.2(L)     Hepatic Function Latest Ref Rng & Units 10/28/2017 07/10/2017 10/18/2008  Total Protein 6.5 - 8.1 g/dL 8.7(H) 8.5(H) 8.2  Albumin 3.5 - 5.0 g/dL 5.1(H) 4.9 4.7  AST 15 - 41 U/L 41 39 41(H)  ALT 14 - 54 U/L 44 24 37(H)  Alk Phosphatase 38 - 126 U/L 103 56 79  Total Bilirubin 0.3 - 1.2 mg/dL 1.0 3.0(H) 1.6(H)    CBC Latest Ref Rng & Units 10/29/2017 10/28/2017 07/17/2017  WBC 4.0 - 10.5 K/uL 9.4 13.1(H) 6.6  Hemoglobin 12.0 - 15.0 g/dL 13.0 14.1 11.5(L)  Hematocrit 36.0 - 46.0 % 38.3 41.1 32.3(L)  Platelets 150 - 400 K/uL 256 290 278   Lab Results  Component Value Date   MCV 93.2 10/29/2017   MCV 92.6 10/28/2017   MCV 89.7 07/17/2017   Lab Results  Component Value Date   TSH 3.240 11/08/2019   No results found for: HGBA1C   BNP No results found for: BNP  ProBNP No results found for: PROBNP   Lipid Panel  No results found for: CHOL, TRIG, HDL, CHOLHDL, VLDL, LDLCALC, LDLDIRECT, LABVLDL   RADIOLOGY: No results found.   Additional studies/ records that were reviewed today include:  Reviewed the records of Dr. Deland Dorsey.  I reviewed the Zio patch monitor.  Laboratory from [name] of September 17, 2019: Hemoglobin 13.3, hematocrit 39.3.  Platelets 220 2K Glucose 94, BUN 11 creatinine 0.6.  LFTs normal.  GFR 103 Cholesterol 229, triglycerides 66, HDL 92, LDL 124  ASSESSMENT:    1. PSVT (paroxysmal  supraventricular tachycardia) (  [place])   2. PVC (premature ventricular contraction)   3. Palpitations   4. Mitral valve prolapse   5. Medication management   6. Essential hypertension   7. Nonrheumatic mitral valve regurgitation     PLAN:  Allison. Mylene Bow is a 73 year old female who is referred by [name] for palpitations.  She has a longstanding history of hypertension for greater than 15 years and currently has been on a regimen of amlodipine 2.5 mg in addition to telmisartan 80 mg daily.  She had recently been found to have frequent PVCs.  A 1 week Zio patch monitor has revealed predominant sinus rhythm but she was found to have 2 episodes of supraventricular tachycardia with the fastest interval lasting 11 beats with a maximum rate at 162 bpm.  She also was noted to have occasional PVCs with rare ventricular couplets as well as rare isolated PACs.  Presently, she is significantly hypertensive on exam.  On physical examination she has multiple systolic clicks and a 2/6 murmur suggestive of mitral regurgitation.  I suspect she has mitral valve prolapse.  At present I am recommending she undergo an echo Doppler study to evaluate systolic and diastolic function as well as valvular architecture.  Her ECG demonstrates sinus tachycardia 104 with frequent PACs and PVCs.  I am recommending initiation of metoprolol succinate 25 mg which would be helpful both for her SVT, PVC suppression as well as probable labile blood pressure.  She will continue with amlodipine and telmisartan.  I am checking thyroid function studies magnesium and a bmet today.  I reviewed additional laboratory by Dr. Shelia Media.  If blood pressure continues to be elevated further titration of metoprolol or amlodipine will be necessary.  I will see her in 6 weeks for reevaluation and further recommendations will be made at that time.   Medication Adjustments/Labs and Tests Ordered: Current medicines are reviewed at length with the  patient today.  Concerns regarding medicines are outlined above.  Medication changes, Labs and Tests ordered today are listed in the Patient Instructions below. Patient Instructions  Medication Instructions:  START METOPROLOL 25MG DAILY If you need a refill on your cardiac medications before your next appointment, please call your pharmacy.  Labwork: TSH,MAG AND BMET TODAY HERE IN OUR OFFICE AT [place]    If you have labs (blood work) drawn today and your tests are completely normal, you will receive your results only by: Marland Kitchen MyChart Message (if you have MyChart) OR . A paper copy in the mail If you have any lab test that is abnormal or we need to change your treatment, we will call you to review the results.  Testing/Procedures: Echocardiogram - Your physician has requested that you have an echocardiogram. Echocardiography is a painless test that uses sound waves to create images of your heart. It provides your doctor with information about the size and shape of your heart and how well your heart's chambers and valves are working. This procedure takes approximately one hour. There are no restrictions for this procedure. This will be performed at our [facility] location - [address].  Follow-Up: IN 6 WEEKS AFTER ECHO In Person Allison Majestic, MD.    At [facility], you and your health needs are our priority.  As part of our continuing mission to provide you with exceptional heart care, we have created designated Provider Care Teams.  These Care Teams include your primary Cardiologist (physician) and Advanced Practice Providers (APPs -  Physician  Assistants and Nurse Practitioners) who all work together to provide you with the care you need, when you need it.  Thank you for choosing [facility] at [facility]!!          Signed, Allison Majestic, MD  11/12/2019 1:29 PM    [facility] [address], [place], [place], [place]  [address] Phone: [phone]

## 2019-11-09 LAB — BASIC METABOLIC PANEL
BUN/Creatinine Ratio: 25 (ref 12–28)
BUN: 15 mg/dL (ref 8–27)
CO2: 24 mmol/L (ref 20–29)
Calcium: 9.6 mg/dL (ref 8.7–10.3)
Chloride: 94 mmol/L — ABNORMAL LOW (ref 96–106)
Creatinine, Ser: 0.61 mg/dL (ref 0.57–1.00)
GFR calc Af Amer: 103 mL/min/{1.73_m2} (ref >59)
GFR calc non Af Amer: 90 mL/min/{1.73_m2} (ref >59)
Glucose: 107 mg/dL — ABNORMAL HIGH (ref 65–99)
Potassium: 4.5 mmol/L (ref 3.5–5.2)
Sodium: 136 mmol/L (ref 134–144)

## 2019-11-09 LAB — MAGNESIUM: Magnesium: 2.2 mg/dL (ref 1.6–2.3)

## 2019-11-09 LAB — TSH: TSH: 3.24 u[IU]/mL (ref 0.450–4.500)

## 2019-11-12 ENCOUNTER — Encounter: Payer: Self-pay | Admitting: Cardiovascular Disease

## 2019-11-17 ENCOUNTER — Ambulatory Visit (HOSPITAL_COMMUNITY): Payer: Medicare HMO | Attending: Cardiovascular Disease

## 2019-11-17 ENCOUNTER — Other Ambulatory Visit: Payer: Self-pay

## 2019-11-17 DIAGNOSIS — I341 Nonrheumatic mitral (valve) prolapse: Secondary | ICD-10-CM | POA: Diagnosis not present

## 2019-11-17 DIAGNOSIS — R002 Palpitations: Secondary | ICD-10-CM | POA: Diagnosis not present

## 2019-11-17 DIAGNOSIS — I493 Ventricular premature depolarization: Secondary | ICD-10-CM | POA: Diagnosis not present

## 2019-12-22 DIAGNOSIS — M810 Age-related osteoporosis without current pathological fracture: Secondary | ICD-10-CM | POA: Diagnosis not present

## 2020-01-04 ENCOUNTER — Encounter: Payer: Self-pay | Admitting: Cardiovascular Disease

## 2020-01-04 ENCOUNTER — Ambulatory Visit: Payer: Medicare HMO | Admitting: Cardiovascular Disease

## 2020-01-04 ENCOUNTER — Other Ambulatory Visit: Payer: Self-pay

## 2020-01-04 VITALS — BP 136/78 | HR 79 | Temp 97.7°F | Ht 66.0 in | Wt 113.0 lb

## 2020-01-04 DIAGNOSIS — I10 Essential (primary) hypertension: Secondary | ICD-10-CM

## 2020-01-04 DIAGNOSIS — R002 Palpitations: Secondary | ICD-10-CM | POA: Diagnosis not present

## 2020-01-04 DIAGNOSIS — I471 Supraventricular tachycardia: Secondary | ICD-10-CM | POA: Diagnosis not present

## 2020-01-04 DIAGNOSIS — I341 Nonrheumatic mitral (valve) prolapse: Secondary | ICD-10-CM

## 2020-01-04 DIAGNOSIS — Q433 Congenital malformations of intestinal fixation: Secondary | ICD-10-CM

## 2020-01-04 MED ORDER — METOPROLOL SUCCINATE ER 50 MG PO TB24: 50.0000 mg | ORAL_TABLET | Freq: Every day | ORAL | 3 refills | Status: DC

## 2020-01-04 NOTE — Progress Notes (Signed)
Cardiology Office Note    Date:  01/04/2020   ID:  [name], DOB 1946-06-08, MRN [id number]  PCP:  Allison Pretty, MD  Cardiologist:  Allison Majestic, MD   F/U cardiology evaluation initially referred by Dr. Junius Dorsey.  History of Present Illness:  [name] is a 74 y.o. female who is the wife of Dr. Marveen Dorsey, a retired rheumatologist.  I saw her for initial cardiology evaluation November 08, 2019.  She presents for 54-monthfollow-up evaluation.  Ms ZRenee Dorsey followed by Dr. PShelia Mediaand was recently evaluated with symptoms of increasing palpitations.  She has a history of Raynaud's syndrome, hypertension on Micardis, restless leg syndrome, and osteoporosis.  Because of episodes of palpitations, she had worn a Zio patch from September 30 through September 29, 2019 and was monitored for 6 days and 21 hours.  The predominant rhythm was sinus rhythm with an average rate at 77 bpm.  Her minimum heart rate was sinus bradycardia at 49 bpm and maximum heart rate was 162 bpm.  She was found to have 2 episodes of supraventricular tachycardia with the fastest interval lasting 11 beats with a maximum rate at 162 bpm.  There were rare isolated PACs and couplets.  Isolated PVCs were occasional at 4.1% rare ventricular couplet without triplets.  There also were episodes of ventricular bigeminy and trigeminy.   She has a history hypertension for at least 15 years.  In addition she has a history of retinal branch vein occlusion of her left eye.  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  She denies any chest pain.  She denies any presyncope or syncope.   When I initially saw her on November 08, 2019, her physical examination was highly suggestive of mitral valve prolapse with multiple systolic clicks and MR murmur at the apex.  I reviewed her Zile patch monitor.  I scheduled her for an echo Doppler study which confirmed my suspicion and revealed normal LV function.  Her  mitral valve was mildly myxomatous with mitral valve prolapse and mild mitral regurgitation.  She had mildly increased RV systolic pressure at 393.8mmHg.  I recommended initiation of Toprol-XL 25 mg which would be helpful both for her labile blood pressure as well as ectopy suppression.  Since initiating Toprol she has felt significantly improved.  She still notes occasional episodes of intermittent heart rate irregularity.  She denies any presyncope or syncope.  She denies any chest pain.  Laboratory revealed TSH 3.2.  Chemistry was normal with exception of minimal glucose increased at 107.  She presents for reevaluation.  Past Medical History:  Diagnosis Date  . Hypertension   . Macular edema   . Osteoporosis     Past Surgical History:  Procedure Laterality Date  . ABDOMINAL ADHESION SURGERY  2009   Dr. EZettie Dorsey  Malrotation - LOA done  . ABDOMINAL HYSTERECTOMY    . APPENDECTOMY    . LAPAROTOMY N/A 07/13/2017   Procedure: EXPLORATORY LAPAROTOMY, ENTEROLYSIS;  Surgeon: Allison Hausen MD;  Location: [facility];  Service: General;  Laterality: N/A;    Current Medications: Outpatient Medications Prior to Visit  Medication Sig Dispense Refill  . amLODipine (NORVASC) 5 MG tablet Take 2.5 mg by mouth daily.    . cetirizine (ZYRTEC) 10 MG tablet Take 10 mg by mouth as needed.     . Cholecalciferol (VITAMIN D3) 2000 units TABS Take 2,000 Units by mouth daily.    . cycloSPORINE (RESTASIS) 0.05 % ophthalmic  emulsion Place 1 drop into both eyes 2 (two) times daily.    Marland Kitchen denosumab (PROLIA) 60 MG/ML SOLN injection Inject 60 mg into the skin every 6 (six) months. Administer in upper arm, thigh, or abdomen    . metoprolol succinate (TOPROL-XL) 25 MG 24 hr tablet Take 1 tablet (25 mg total) by mouth daily. Take with or immediately following a meal. 30 tablet 3  . metroNIDAZOLE (METROGEL) 0.75 % gel Apply 1 application topically 2 (two) times daily. Pt applies to face.    . Multiple Vitamins-Calcium  (ONE-A-DAY WOMENS PO) Take 1 tablet daily by mouth.    Marland Kitchen rOPINIRole (REQUIP) 0.5 MG tablet Take 0.25 mg at bedtime by mouth.  4  . telmisartan (MICARDIS) 80 MG tablet Take 80 mg by mouth daily.     No facility-administered medications prior to visit.     Allergies:   Patient has no known allergies.   Social History   Socioeconomic History  . Marital status: Married    Spouse name: Not on file  . Number of children: Not on file  . Years of education: Not on file  . Highest education level: Not on file  Occupational History  . Not on file  Tobacco Use  . Smoking status: Never Smoker  . Smokeless tobacco: Never Used  Substance and Sexual Activity  . Alcohol use: Yes    Alcohol/week: 1.0 standard drinks    Types: 1 Glasses of wine per week    Comment: daily  . Drug use: No  . Sexual activity: Never  Other Topics Concern  . Not on file  Social History Narrative  . Not on file   Social Determinants of Health   Financial Resource Strain:   . Difficulty of Paying Living Expenses: Not on file  Food Insecurity:   . Worried About Charity fundraiser in the Last Year: Not on file  . Ran Out of Food in the Last Year: Not on file  Transportation Needs:   . Lack of Transportation (Medical): Not on file  . Lack of Transportation (Non-Medical): Not on file  Physical Activity:   . Days of Exercise per Week: Not on file  . Minutes of Exercise per Session: Not on file  Stress:   . Feeling of Stress : Not on file  Social Connections:   . Frequency of Communication with Friends and Family: Not on file  . Frequency of Social Gatherings with Friends and Family: Not on file  . Attends Religious Services: Not on file  . Active Member of Clubs or Organizations: Not on file  . Attends Archivist Meetings: Not on file  . Marital Status: Not on file    Socially she is married to Dr. Marveen Dorsey for 50 years.  She has 4 children and 2 grandchildren.  She has a Scientist, water quality.  She  drinks occasional red wine.  She walks for approximately half hour 4 days/week.  There is no tobacco history.  Family History:  The patient's family history includes Hypertension in her mother; Osteoarthritis in her mother; Stroke in her mother.   Parents are deceased.  Her mother died at age 13 and had a history of atrial fibrillation, stroke and congestive heart failure.  Her father died at age 10 and had a subarachnoid hemorrhage and died suddenly.  ROS General: Negative; No fevers, chills, or night sweats;  HEENT: History of retinal branch vein occlusion of her left eye; No changes in vision or hearing, sinus  congestion, difficulty swallowing Pulmonary: Negative; No cough, wheezing, shortness of breath, hemoptysis Cardiovascular: Negative; No chest pain, presyncope, syncope, palpitations GI: Congenital malrotation of her intestines GU: Negative; No dysuria, hematuria, or difficulty voiding Musculoskeletal: Negative; no myalgias, joint pain, or weakness Hematologic/Oncology: Negative; no easy bruising, bleeding Endocrine: Negative; no heat/cold intolerance; no diabetes Neuro: History of rosacea Psychiatric: Negative; No behavioral problems, depression Sleep: Negative; No snoring, daytime sleepiness, hypersomnolence, bruxism, restless legs, hypnogognic hallucinations, no cataplexy Other comprehensive 14 point system review is negative.   PHYSICAL EXAM:   VS:  BP 136/78 (BP Location: Left Arm, Patient Position: Sitting, Cuff Size: Normal)   Pulse 79   Temp 97.7 F (36.5 C)   Ht 5' 6"  (1.676 m)   Wt 113 lb (51.3 kg)   BMI 18.24 kg/m     Repeat blood pressure by me was significantly improved from her prior valuation in November but was still mildly increased at 146/80.  Pulse was 79.  Wt Readings from Last 3 Encounters:  01/04/20 113 lb (51.3 kg)  11/08/19 111 lb (50.3 kg)  10/30/17 108 lb 3 oz (49.1 kg)    General: Alert, oriented, no distress.  Skin: normal turgor, no rashes,  warm and dry HEENT: Normocephalic, atraumatic. Pupils equal round and reactive to light; sclera anicteric; extraocular muscles intact;  Nose without nasal septal hypertrophy Mouth/Parynx benign; Mallinpatti scale 2 Neck: No JVD, no carotid bruits; normal carotid upstroke Lungs: clear to ausculatation and percussion; no wheezing or rales Chest wall: without tenderness to palpitation Heart: PMI not displaced, RRR occasional isolated ectopic complexes, s1 s2 normal, multiple systolic clicks with a 2/6 murmur suggestive of MR at the apex, no diastolic murmur, no rubs, gallops, thrills, or heaves Abdomen: soft, nontender; no hepatosplenomehaly, BS+; abdominal aorta nontender and not dilated by palpation. Back: no CVA tenderness Pulses 2+ Musculoskeletal: full range of motion, normal strength, no joint deformities Extremities: no clubbing cyanosis or edema, Homan's sign negative  Neurologic: grossly nonfocal; Cranial nerves grossly wnl Psychologic: Normal mood and affect   Studies/Labs Reviewed:   EKG:  EKG is ordered today.  ECG (independently read by me): Normal sinus rhythm at 70; mild RV conduction delay     November 2020 ECG (independently read by me): Sinus tachycardia at 104 with frequent PVCs and PAC.  Recent Labs: BMP Latest Ref Rng & Units 11/08/2019 10/31/2017 10/30/2017  Glucose 65 - 99 mg/dL 107(H) 89 111(H)  BUN 8 - 27 mg/dL 15 9 12   Creatinine 0.57 - 1.00 mg/dL 0.61 0.41(L) 0.50  BUN/Creat Ratio 12 - 28 25 - -  Sodium 134 - 144 mmol/L 136 141 143  Potassium 3.5 - 5.2 mmol/L 4.5 3.5 3.7  Chloride 96 - 106 mmol/L 94(L) 109 110  CO2 20 - 29 mmol/L 24 26 28   Calcium 8.7 - 10.3 mg/dL 9.6 8.0(L) 8.2(L)     Hepatic Function Latest Ref Rng & Units 10/28/2017 07/10/2017 10/18/2008  Total Protein 6.5 - 8.1 g/dL 8.7(H) 8.5(H) 8.2  Albumin 3.5 - 5.0 g/dL 5.1(H) 4.9 4.7  AST 15 - 41 U/L 41 39 41(H)  ALT 14 - 54 U/L 44 24 37(H)  Alk Phosphatase 38 - 126 U/L 103 56 79  Total  Bilirubin 0.3 - 1.2 mg/dL 1.0 3.0(H) 1.6(H)    CBC Latest Ref Rng & Units 10/29/2017 10/28/2017 07/17/2017  WBC 4.0 - 10.5 K/uL 9.4 13.1(H) 6.6  Hemoglobin 12.0 - 15.0 g/dL 13.0 14.1 11.5(L)  Hematocrit 36.0 - 46.0 % 38.3 41.1 32.3(L)  Platelets 150 - 400 K/uL 256 290 278   Lab Results  Component Value Date   MCV 93.2 10/29/2017   MCV 92.6 10/28/2017   MCV 89.7 07/17/2017   Lab Results  Component Value Date   TSH 3.240 11/08/2019   No results found for: HGBA1C   BNP No results found for: BNP  ProBNP No results found for: PROBNP   Lipid Panel  No results found for: CHOL, TRIG, HDL, CHOLHDL, VLDL, LDLCALC, LDLDIRECT, LABVLDL   RADIOLOGY: No results found.   Additional studies/ records that were reviewed today include:  Reviewed the records of Dr. Deland Dorsey.  I reviewed the Zio patch monitor.  Laboratory from Dr. Junius Dorsey of September 17, 2019: Hemoglobin 13.3, hematocrit 39.3.  Platelets 220 2K Glucose 94, BUN 11 creatinine 0.6.  LFTs normal.  GFR 103 Cholesterol 229, triglycerides 66, HDL 92, LDL 12  ECHO 11/17/2019 1. Left ventricular ejection fraction, by visual estimation, is 60 to 65%. The left ventricle has normal function. There is no left ventricular hypertrophy. 2. The left ventricle has no regional wall motion abnormalities. 3. Global right ventricle has normal systolic function.The right ventricular size is normal. No increase in right ventricular wall thickness. 4. Left atrial size was normal. 5. Right atrial size was normal. 6. Mild mitral valve prolapse. 7. The mitral valve is myxomatous. Mild mitral valve regurgitation. No evidence of mitral stenosis. 8. The tricuspid valve is normal in structure. Tricuspid valve regurgitation mild-moderate. 9. The aortic valve is normal in structure. Aortic valve regurgitation is not visualized. No evidence of aortic valve sclerosis or stenosis. 10. The pulmonic valve was normal in structure. Pulmonic valve  regurgitation is not visualized. 11. Mildly elevated pulmonary artery systolic pressure. 12. The tricuspid regurgitant velocity is 2.68 m/s, and with an assumed right atrial pressure of 3 mmHg, the estimated right ventricular systolic pressure is mildly elevated at 31.7 mmHg. 13. The inferior vena cava is normal in size with greater  ASSESSMENT:    1. Palpitations   2. PSVT (paroxysmal supraventricular tachycardia) ([place])   3. Essential hypertension   4. Mitral valve prolapse   5. Intestinal malrotation     PLAN:  Ms. Roslyn Else is a 74 year old female who is referred by Dr. Junius Dorsey for palpitations.  She has a longstanding history of hypertension for greater than 15 years and when I initially saw her was on a regimen of amlodipine 2.5 mg in addition to telmisartan 80 mg daily.  She was significantly hypertensive and on cardiac monitoring she was found to have frequent PVCs as well as 2 episodes of supraventricular tachycardia with the fastest interval lasting 11 beats with a maximum rate at 162 bpm.  She also had several ventricular couplets as well as isolated PACs.  She has been on Toprol-XL 25 mg since that evaluation and has noticed significant improvement in the frequency of her palpitations.  Her blood pressure was stage II hypertension at her last office visit.  This has improved but is still mildly elevated.  On exam she still has occasional isolated ectopic complexes.  Her 2D echo Doppler study has confirmed normal systolic function with mildly myxomatous mitral valve with mitral valve prolapse and at least mild mitral regurgitation.  As only, I am recommending slight titration of her Toprol-XL to 37.5 mg for the next 6 days and then she will increase this to 50 mg daily.  Will continue with her current dose of amlodipine and telmisartan.  Recent thyroid function studies  remained stable for the TSH of 3.2 and she had normal magnesium at 2.2.  She will follow-up with Dr. Shelia Dorsey.  I will  see her in 4 months for cardiology reevaluation.    Medication Adjustments/Labs and Tests Ordered: Current medicines are reviewed at length with the patient today.  Concerns regarding medicines are outlined above.  Medication changes, Labs and Tests ordered today are listed in the Patient Instructions below. Patient Instructions  Medication Instructions:  For the next 5 days, take a pill and a half of Metoprolol daily(37.84m). Then take 512mMetoprolol daily (new prescription)   If you need a refill on your cardiac medications before your next appointment, please call your pharmacy.   Lab work: NONE  Testing/Procedures: NONE  Follow-Up: At [facility] your [facility] needs are our priority.  As part of our continuing mission to provide you with exceptional heart care, we have created designated Provider Care Teams.  These Care Teams include your primary Cardiologist (physician) and Advanced Practice Providers (APPs -  Physician Assistants and Nurse Practitioners) who all work together to provide you with the care you need, when you need it. You may see Dr. KeClaiborne Billingsr one of the following Advanced Practice Providers on your designated Care Team:    HaAlmyra DeforestPA-C  AnFabian SharpPAVermontr   KrRoby LoftsPAVermontYour physician wants you to follow-up in: 4 months in the office with Dr. KeClaiborne Billings        Signed, ThShelva MajesticMD  01/04/2020 2:06 PM    [address]Su[place]Gr[place]  [id number]: [phone]

## 2020-01-04 NOTE — Patient Instructions (Signed)
Medication Instructions:  For the next 5 days, take a pill and a half of Metoprolol daily(37.5mg ). Then take 50mg  Metoprolol daily (new prescription)   If you need a refill on your cardiac medications before your next appointment, please call your pharmacy.   Lab work: NONE  Testing/Procedures: NONE  Follow-Up: At , you and your health needs are our priority.  As part of our continuing mission to provide you with exceptional heart care, we have created designated Provider Care Teams.  These Care Teams include your primary Cardiologist (physician) and Advanced Practice Providers (APPs -  Physician Assistants and Nurse Practitioners) who all work together to provide you with the care you need, when you need it. You may see Dr. [employer] or one of the following Advanced Practice Providers on your designated Care Team:    Tresa Endo, PA-C  Azalee Course, Micah Flesher or   [place], Judy Pimple  Your physician wants you to follow-up in: 4 months in the office with Dr. [place]

## 2020-01-21 ENCOUNTER — Ambulatory Visit: Payer: Medicare HMO

## 2020-01-29 ENCOUNTER — Ambulatory Visit: Payer: Medicare HMO | Attending: Internal Medicine

## 2020-01-29 DIAGNOSIS — Z23 Encounter for immunization: Secondary | ICD-10-CM

## 2020-01-29 NOTE — Progress Notes (Signed)
   Covid-19 Vaccination Clinic  Name:  Vieva Brummitt    MRN: [id number] DOB: 1946-11-03  01/29/2020  Ms. Ziebell was observed post Covid-19 immunization for 15 minutes without incidence. She was provided with Vaccine Information Sheet and instruction to access the V-Safe system.   Ms. Berkey was instructed to call 911 with any severe reactions post vaccine: Marland Kitchen Difficulty breathing  . Swelling of your face and throat  . A fast heartbeat  . A bad rash all over your body  . Dizziness and weakness    Immunizations Administered    Name Date Dose VIS Date Route   Pfizer COVID-19 Vaccine 01/29/2020 11:12 AM 0.3 mL 12/03/2019 Intramuscular   Manufacturer: [employer], [employer]   Lot: [id number]   NDC: [address]-7

## 2020-02-01 ENCOUNTER — Ambulatory Visit: Payer: Medicare HMO

## 2020-02-03 DIAGNOSIS — I493 Ventricular premature depolarization: Secondary | ICD-10-CM | POA: Diagnosis not present

## 2020-02-22 ENCOUNTER — Ambulatory Visit: Payer: Medicare HMO | Attending: Internal Medicine

## 2020-02-22 ENCOUNTER — Ambulatory Visit: Payer: Medicare HMO

## 2020-02-22 DIAGNOSIS — Z23 Encounter for immunization: Secondary | ICD-10-CM | POA: Insufficient documentation

## 2020-02-22 NOTE — Progress Notes (Signed)
   Covid-19 Vaccination Clinic  Name:  Radie Berges    MRN: [id number] DOB: 30-Dec-1945  02/22/2020  Ms. Nez was observed post Covid-19 immunization for 15 minutes without incident. She was provided with Vaccine Information Sheet and instruction to access the V-Safe system.   Ms. Albornoz was instructed to call 911 with any severe reactions post vaccine: Marland Kitchen Difficulty breathing  . Swelling of face and throat  . A fast heartbeat  . A bad rash all over body  . Dizziness and weakness   Immunizations Administered    Name Date Dose VIS Date Route   Pfizer COVID-19 Vaccine 02/22/2020  4:08 PM 0.3 mL 12/03/2019 Intramuscular   Manufacturer: [employer], [employer]   Lot: [id number]   NDC: [address]-2

## 2020-03-27 ENCOUNTER — Ambulatory Visit (INDEPENDENT_AMBULATORY_CARE_PROVIDER_SITE_OTHER): Payer: Medicare HMO | Admitting: Ophthalmology

## 2020-03-27 ENCOUNTER — Encounter (INDEPENDENT_AMBULATORY_CARE_PROVIDER_SITE_OTHER): Payer: Self-pay | Admitting: Ophthalmology

## 2020-03-27 ENCOUNTER — Other Ambulatory Visit: Payer: Self-pay

## 2020-03-27 DIAGNOSIS — H35032 Hypertensive retinopathy, left eye: Secondary | ICD-10-CM

## 2020-03-27 DIAGNOSIS — H2511 Age-related nuclear cataract, right eye: Secondary | ICD-10-CM | POA: Insufficient documentation

## 2020-03-27 DIAGNOSIS — H34832 Tributary (branch) retinal vein occlusion, left eye, with macular edema: Secondary | ICD-10-CM | POA: Insufficient documentation

## 2020-03-27 DIAGNOSIS — H35352 Cystoid macular degeneration, left eye: Secondary | ICD-10-CM

## 2020-03-27 DIAGNOSIS — H2512 Age-related nuclear cataract, left eye: Secondary | ICD-10-CM

## 2020-03-27 HISTORY — DX: Age-related nuclear cataract, left eye: H25.12

## 2020-03-27 MED ORDER — AFLIBERCEPT 2MG/0.05ML IZ SOLN FOR KALEIDOSCOPE
2.0000 mg | INTRAVITREAL | Status: AC | PRN
  Administered 2020-03-27: 2 mg via INTRAVITREAL

## 2020-03-27 NOTE — Assessment & Plan Note (Signed)
.  [name]

## 2020-05-16 ENCOUNTER — Ambulatory Visit: Payer: Medicare HMO | Admitting: Cardiovascular Disease

## 2020-05-16 ENCOUNTER — Encounter: Payer: Self-pay | Admitting: Cardiovascular Disease

## 2020-05-16 ENCOUNTER — Other Ambulatory Visit: Payer: Self-pay

## 2020-05-16 DIAGNOSIS — M810 Age-related osteoporosis without current pathological fracture: Secondary | ICD-10-CM

## 2020-05-16 DIAGNOSIS — R002 Palpitations: Secondary | ICD-10-CM

## 2020-05-16 DIAGNOSIS — I471 Supraventricular tachycardia: Secondary | ICD-10-CM

## 2020-05-16 DIAGNOSIS — I10 Essential (primary) hypertension: Secondary | ICD-10-CM | POA: Diagnosis not present

## 2020-05-16 DIAGNOSIS — I341 Nonrheumatic mitral (valve) prolapse: Secondary | ICD-10-CM | POA: Diagnosis not present

## 2020-05-16 DIAGNOSIS — G2581 Restless legs syndrome: Secondary | ICD-10-CM

## 2020-05-16 DIAGNOSIS — Q433 Congenital malformations of intestinal fixation: Secondary | ICD-10-CM | POA: Diagnosis not present

## 2020-05-16 NOTE — Progress Notes (Signed)
Cardiology Office Note    Date:  05/16/2020   ID:  [name], DOB 1946/06/26, MRN [id number]  PCP:  Deland Pretty, MD  Cardiologist:  Shelva Majestic, MD   F/U cardiology evaluation initially referred by Dr. Junius Roads.  History of Present Illness:  [name] is a 74 y.o. female who is the wife of Dr. Marveen Reeks, a retired rheumatologist.  I saw her for initial cardiology evaluation November 08, 2019.  She presents for 4 month follow-up evaluation.  [name] is followed by Dr. Shelia Media and was recently evaluated with symptoms of increasing palpitations.  She has a history of Raynaud's syndrome, hypertension on Micardis, restless leg syndrome, and osteoporosis.  Because of episodes of palpitations, she had worn a Zio patch from September 30 through September 29, 2019 and was monitored for 6 days and 21 hours.  The predominant rhythm was sinus rhythm with an average rate at 77 bpm.  Her minimum heart rate was sinus bradycardia at 49 bpm and maximum heart rate was 162 bpm.  She was found to have 2 episodes of supraventricular tachycardia with the fastest interval lasting 11 beats with a maximum rate at 162 bpm.  There were rare isolated PACs and couplets.  Isolated PVCs were occasional at 4.1% rare ventricular couplet without triplets.  There also were episodes of ventricular bigeminy and trigeminy.   She has a history hypertension for at least 15 years.  In addition she has a history of retinal branch vein occlusion of her left eye.  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  She denies any chest pain.  She denies any presyncope or syncope.   When I initially saw her on November 08, 2019, her physical examination was highly suggestive of mitral valve prolapse with multiple systolic clicks and MR murmur at the apex.  I reviewed her Zio patch monitor.  I scheduled her for an echo Doppler study which confirmed my suspicion and revealed normal LV function.  Her  mitral valve was mildly myxomatous with mitral valve prolapse and mild mitral regurgitation.  She had mildly increased RV systolic pressure at 30.0 mmHg.  I recommended initiation of Toprol-XL 25 mg which would be helpful both for her labile blood pressure as well as ectopy suppression.  Since initiating Toprol she has felt significantly improved.  She still notes occasional episodes of intermittent heart rate irregularity.  She denies any presyncope or syncope.  She denies any chest pain.  Laboratory revealed TSH 3.2.  Chemistry was normal with exception of minimal glucose increased at 107.    I last saw her in January 2021.  At that time, her palpitations had improved with her Toprol regimen.  Pressure continues to be mildly elevated.  I recommended titration of Toprol-XL to 37.5 mg for 1 week and then to increase this to 50 mg daily.  She continued be on amlodipine 2.5 mg and telmisartan 80 mg.  Over the past 4 months, she has felt significantly improved on the increased Toprol regimen.  She denies any awareness of any palpitations.  Her blood pressure has now been well controlled.  She denies shortness of breath.  She denies palpitations.  She denies abdominal pain.  She sees Dr. Deland Pretty for primary care who checks laboratory.  She presents for reevaluation.  Past Medical History:  Diagnosis Date  . Hypertension   . Macular edema   . Osteoporosis     Past Surgical History:  Procedure Laterality Date  .  ABDOMINAL ADHESION SURGERY  2009   Dr. Zettie Pho.  Malrotation - LOA done  . ABDOMINAL HYSTERECTOMY    . APPENDECTOMY    . LAPAROTOMY N/A 07/13/2017   Procedure: EXPLORATORY LAPAROTOMY, ENTEROLYSIS;  Surgeon: Johnathan Hausen, MD;  Location: [facility];  Service: General;  Laterality: N/A;    Current Medications: Outpatient Medications Prior to Visit  Medication Sig Dispense Refill  . amLODipine (NORVASC) 5 MG tablet Take 2.5 mg by mouth daily.    . cetirizine (ZYRTEC) 10 MG tablet Take 10 mg  by mouth as needed.     . Cholecalciferol (VITAMIN D3) 2000 units TABS Take 2,000 Units by mouth daily.    . cycloSPORINE (RESTASIS) 0.05 % ophthalmic emulsion Place 1 drop into both eyes 2 (two) times daily.    Marland Kitchen denosumab (PROLIA) 60 MG/ML SOLN injection Inject 60 mg into the skin every 6 (six) months. Administer in upper arm, thigh, or abdomen    . metoprolol succinate (TOPROL XL) 50 MG 24 hr tablet Take 1 tablet (50 mg total) by mouth daily. 90 tablet 3  . metroNIDAZOLE (METROGEL) 0.75 % gel Apply 1 application topically 2 (two) times daily. Pt applies to face.    . Multiple Vitamins-Calcium (ONE-A-DAY WOMENS PO) Take 1 tablet daily by mouth.    Marland Kitchen rOPINIRole (REQUIP) 0.5 MG tablet Take 0.25 mg at bedtime by mouth.  4  . telmisartan (MICARDIS) 80 MG tablet Take 80 mg by mouth daily.    . metoprolol succinate (TOPROL-XL) 25 MG 24 hr tablet Take 1 tablet (25 mg total) by mouth daily. Take with or immediately following a meal. 30 tablet 3   No facility-administered medications prior to visit.     Allergies:   Patient has no known allergies.   Social History   Socioeconomic History  . Marital status: Married    Spouse name: Not on file  . Number of children: Not on file  . Years of education: Not on file  . Highest education level: Not on file  Occupational History  . Not on file  Tobacco Use  . Smoking status: Never Smoker  . Smokeless tobacco: Never Used  Substance and Sexual Activity  . Alcohol use: Yes    Alcohol/week: 1.0 standard drinks    Types: 1 Glasses of wine per week    Comment: daily  . Drug use: No  . Sexual activity: Never  Other Topics Concern  . Not on file  Social History Narrative  . Not on file   Social Determinants of Health   Financial Resource Strain:   . Difficulty of Paying Living Expenses:   Food Insecurity:   . Worried About Charity fundraiser in the Last Year:   . Arboriculturist in the Last Year:   Transportation Needs:   . Lexicographer (Medical):   Marland Kitchen Lack of Transportation (Non-Medical):   Physical Activity:   . Days of Exercise per Week:   . Minutes of Exercise per Session:   Stress:   . Feeling of Stress :   Social Connections:   . Frequency of Communication with Friends and Family:   . Frequency of Social Gatherings with Friends and Family:   . Attends Religious Services:   . Active Member of Clubs or Organizations:   . Attends Archivist Meetings:   Marland Kitchen Marital Status:     Socially she is married to Dr. Marveen Reeks for 50 years.  She has 4 children and 2 grandchildren.  She has a Scientist, water quality.  She drinks occasional red wine.  She walks for approximately half hour 4 days/week.  There is no tobacco history.  Family History:  The patient's family history includes Hypertension in her mother; Osteoarthritis in her mother; Stroke in her mother.   Parents are deceased.  Her mother died at age 56 and had a history of atrial fibrillation, stroke and congestive heart failure.  Her father died at age 64 and had a subarachnoid hemorrhage and died suddenly.  ROS General: Negative; No fevers, chills, or night sweats;  HEENT: History of retinal branch vein occlusion of her left eye; No changes in vision or hearing, sinus congestion, difficulty swallowing Pulmonary: Negative; No cough, wheezing, shortness of breath, hemoptysis Cardiovascular: Negative; No chest pain, presyncope, syncope, palpitations GI: Congenital malrotation of her intestines GU: Negative; No dysuria, hematuria, or difficulty voiding Musculoskeletal: Negative; no myalgias, joint pain, or weakness Hematologic/Oncology: Negative; no easy bruising, bleeding Endocrine: Negative; no heat/cold intolerance; no diabetes Neuro: History of rosacea Psychiatric: Negative; No behavioral problems, depression Sleep: History of restless legs on requip;  No snoring, daytime sleepiness, hypersomnolence, bruxism, restless legs, hypnogognic  hallucinations, no cataplexy Other comprehensive 14 point system review is negative.   PHYSICAL EXAM:   VS:  BP 112/78   Pulse (!) 56   Temp (!) 97.2 F (36.2 C)   Ht _0  (1.676 m)   Wt 112 lb 12.8 oz (51.2 kg)   SpO2 96%   BMI 18.21 kg/m     Repeat blood pressure by me 122/78  Wt Readings from Last 3 Encounters:  05/16/20 112 lb 12.8 oz (51.2 kg)  01/04/20 113 lb (51.3 kg)  11/08/19 111 lb (50.3 kg)    General: Alert, oriented, no distress.  Thin. Skin: normal turgor, no rashes, warm and dry HEENT: Normocephalic, atraumatic. Pupils equal round and reactive to light; sclera anicteric; extraocular muscles intact;  Nose without nasal septal hypertrophy Mouth/Parynx benign; Mallinpatti scale 2Neck: No JVD, no carotid bruits; normal carotid upstroke Lungs: clear to ausculatation and percussion; no wheezing or rales Chest wall: without tenderness to palpitation Heart: PMI not displaced, RRR, s1 s2 normal, 7-7/4 systolic murmur with multiple systolic clicks consistent with her myxomatous mitral valve/MVP/MR, no diastolic murmur, no rubs, gallops, thrills, or heaves Abdomen: soft, nontender; no hepatosplenomehaly, BS+; abdominal aorta nontender and not dilated by palpation. Back: no CVA tenderness Pulses 2+ Musculoskeletal: full range of motion, normal strength, no joint deformities Extremities: Mild varicosities; bno clubbing cyanosis or edema, Homan's sign negative  Neurologic: grossly nonfocal; Cranial nerves grossly wnl Psychologic: Normal mood and affect    Studies/Labs Reviewed:   EKG:  EKG is ordered today.  ECG (independently read by me): Sinus bradycardia at 56 bpm.  No ectopy.  Normal intervals.  January 2021 ECG (independently read by me): Normal sinus rhythm at 70; mild RV conduction delay     November 2020 ECG (independently read by me): Sinus tachycardia at 104 with frequent PVCs and PAC.  Recent Labs: BMP Latest Ref Rng & Units 11/08/2019 10/31/2017 10/30/2017   Glucose 65 - 99 mg/dL 107(H) 89 111(H)  BUN 8 - 27 mg/dL _1 Creatinine 0.57 - 1.00 mg/dL 0.61 0.41(L) 0.50  BUN/Creat Ratio 12 - 28 25 - -  Sodium 134 - 144 mmol/L 136 141 143  Potassium 3.5 - 5.2 mmol/L 4.5 3.5 3.7  Chloride 96 - 106 mmol/L 94(L) 109 110  CO2 20 - 29 mmol/L _2 Calcium  8.7 - 10.3 mg/dL 9.6 8.0(L) 8.2(L)     Hepatic Function Latest Ref Rng & Units 10/28/2017 07/10/2017 10/18/2008  Total Protein 6.5 - 8.1 g/dL 8.7(H) 8.5(H) 8.2  Albumin 3.5 - 5.0 g/dL 5.1(H) 4.9 4.7  AST 15 - 41 U/L 41 39 41(H)  ALT 14 - 54 U/L 44 24 37(H)  Alk Phosphatase 38 - 126 U/L 103 56 79  Total Bilirubin 0.3 - 1.2 mg/dL 1.0 3.0(H) 1.6(H)    CBC Latest Ref Rng & Units 10/29/2017 10/28/2017 07/17/2017  WBC 4.0 - 10.5 K/uL 9.4 13.1(H) 6.6  Hemoglobin 12.0 - 15.0 g/dL 13.0 14.1 11.5(L)  Hematocrit 36.0 - 46.0 % 38.3 41.1 32.3(L)  Platelets 150 - 400 K/uL 256 290 278   Lab Results  Component Value Date   MCV 93.2 10/29/2017   MCV 92.6 10/28/2017   MCV 89.7 07/17/2017   Lab Results  Component Value Date   TSH 3.240 11/08/2019   No results found for: HGBA1C   BNP No results found for: BNP  ProBNP No results found for: PROBNP   Lipid Panel  No results found for: CHOL, TRIG, HDL, CHOLHDL, VLDL, LDLCALC, LDLDIRECT, LABVLDL   RADIOLOGY: No results found.   Additional studies/ records that were reviewed today include:  Reviewed the records of Dr. Deland Pretty.  I reviewed the Zio patch monitor.  Laboratory from Dr. Junius Roads of September 17, 2019: Hemoglobin 13.3, hematocrit 39.3.  Platelets 220 2K Glucose 94, BUN 11 creatinine 0.6.  LFTs normal.  GFR 103 Cholesterol 229, triglycerides 66, HDL 92, LDL 12  ECHO 11/17/2019 1. Left ventricular ejection fraction, by visual estimation, is 60 to 65%. The left ventricle has normal function. There is no left ventricular hypertrophy. 2. The left ventricle has no regional wall motion abnormalities. 3. Global right  ventricle has normal systolic function.The right ventricular size is normal. No increase in right ventricular wall thickness. 4. Left atrial size was normal. 5. Right atrial size was normal. 6. Mild mitral valve prolapse. 7. The mitral valve is myxomatous. Mild mitral valve regurgitation. No evidence of mitral stenosis. 8. The tricuspid valve is normal in structure. Tricuspid valve regurgitation mild-moderate. 9. The aortic valve is normal in structure. Aortic valve regurgitation is not visualized. No evidence of aortic valve sclerosis or stenosis. 10. The pulmonic valve was normal in structure. Pulmonic valve regurgitation is not visualized. 11. Mildly elevated pulmonary artery systolic pressure. 12. The tricuspid regurgitant velocity is 2.68 m/s, and with an assumed right atrial pressure of 3 mmHg, the estimated right ventricular systolic pressure is mildly elevated at 31.7 mmHg. 13. The inferior vena cava is normal in size with greater  ASSESSMENT:    1. Palpitations   2. PSVT (paroxysmal supraventricular tachycardia) ([place])   3. Essential hypertension   4. Mitral valve prolapse   5. Intestinal malrotation   6. Age related osteoporosis, unspecified pathological fracture presence   7. Restless legs syndrome (RLS)     PLAN:  [name] is a 74 year old female who was referred by Dr. Junius Roads for palpitations.  She has a longstanding history of hypertension for greater than 16 years and when I initially saw her was on a regimen of amlodipine 2.5 mg in addition to telmisartan 80 mg daily.  She was significantly hypertensive and on cardiac monitoring she was found to have frequent PVCs as well as 2 episodes of supraventricular tachycardia with the fastest interval lasting 11 beats with a maximum rate at 162 bpm.  She  also had several ventricular couplets as well as isolated PACs.  When I initially saw her she had stage II hypertension.  Her palpitations have significantly improved  with initiation of metoprolol succinate.  With her most recent titration up to 50 mg, she is now completely unaware of any recurrent ectopy and her blood pressure has remained stable.  His thyroid studies were normal with a TSH of 3.2 and a normal magnesium of 2.2.  I recommended she continue her multidrug regimen of amlodipine 2.5 mg, Toprol-XL 50 mg and telmisartan 80 mg daily for blood pressure.  She continues to be on well.  Her new role for restless legs with benefit.  She has osteoporosis and is on Prolia injections every 6 months.  I discussed importance of exercise.  She currently has 2 grandchildren will be expecting a third later this year.  She will have repeat laboratory with Dr. Loney Loh.  I will asked that these be sent to me for my review.  I will see her in 1 year for reevaluation or sooner as needed.  Medication Adjustments/Labs and Tests Ordered: Current medicines are reviewed at length with the patient today.  Concerns regarding medicines are outlined above.  Medication changes, Labs and Tests ordered today are listed in the Patient Instructions below. Patient Instructions  Medication Instructions:  CONTINUE WITH CURRENT MEDICATIONS. NO CHANGES.  *If you need a refill on your cardiac medications before your next appointment, please call your pharmacy*   Follow-Up: At [facility], you and your health needs are our priority.  As part of our continuing mission to provide you with exceptional heart care, we have created designated Provider Care Teams.  These Care Teams include your primary Cardiologist (physician) and Advanced Practice Providers (APPs -  Physician Assistants and Nurse Practitioners) who all work together to provide you with the care you need, when you need it.  We recommend signing up for the patient portal called "MyChart".  Sign up information is provided on this After Visit Summary.  MyChart is used to connect with patients for Virtual Visits (Telemedicine).  Patients  are able to view lab/test results, encounter notes, upcoming appointments, etc.  Non-urgent messages can be sent to your provider as well.   To learn more about what you can do with MyChart, go to [email or web address].    Your next appointment:   12 month(s)  The format for your next appointment:   In Person  Provider:   Shelva Majestic, MD       Signed, Shelva Majestic, MD  05/16/2020 3:22 PM    [facility] [address] [place], [place],   [address] Phone: [phone]

## 2020-05-16 NOTE — Patient Instructions (Signed)

## 2020-06-27 DIAGNOSIS — M810 Age-related osteoporosis without current pathological fracture: Secondary | ICD-10-CM | POA: Diagnosis not present

## 2020-09-25 DIAGNOSIS — I10 Essential (primary) hypertension: Secondary | ICD-10-CM | POA: Diagnosis not present

## 2020-09-27 DIAGNOSIS — R636 Underweight: Secondary | ICD-10-CM | POA: Diagnosis not present

## 2020-09-27 DIAGNOSIS — L719 Rosacea, unspecified: Secondary | ICD-10-CM | POA: Diagnosis not present

## 2020-09-27 DIAGNOSIS — Z23 Encounter for immunization: Secondary | ICD-10-CM | POA: Diagnosis not present

## 2020-09-27 DIAGNOSIS — I10 Essential (primary) hypertension: Secondary | ICD-10-CM | POA: Diagnosis not present

## 2020-09-27 DIAGNOSIS — Q433 Congenital malformations of intestinal fixation: Secondary | ICD-10-CM | POA: Diagnosis not present

## 2020-09-27 DIAGNOSIS — Z0000 Encounter for general adult medical examination without abnormal findings: Secondary | ICD-10-CM | POA: Diagnosis not present

## 2020-09-27 DIAGNOSIS — E7800 Pure hypercholesterolemia, unspecified: Secondary | ICD-10-CM | POA: Diagnosis not present

## 2020-09-27 DIAGNOSIS — G2581 Restless legs syndrome: Secondary | ICD-10-CM | POA: Diagnosis not present

## 2020-09-27 DIAGNOSIS — M810 Age-related osteoporosis without current pathological fracture: Secondary | ICD-10-CM | POA: Diagnosis not present

## 2020-10-02 ENCOUNTER — Other Ambulatory Visit: Payer: Self-pay | Admitting: Internal Medicine

## 2020-10-02 DIAGNOSIS — Z1231 Encounter for screening mammogram for malignant neoplasm of breast: Secondary | ICD-10-CM

## 2020-10-04 DIAGNOSIS — I10 Essential (primary) hypertension: Secondary | ICD-10-CM | POA: Diagnosis not present

## 2020-10-04 DIAGNOSIS — Z01419 Encounter for gynecological examination (general) (routine) without abnormal findings: Secondary | ICD-10-CM | POA: Diagnosis not present

## 2020-10-04 DIAGNOSIS — Z1212 Encounter for screening for malignant neoplasm of rectum: Secondary | ICD-10-CM | POA: Diagnosis not present

## 2020-10-09 ENCOUNTER — Other Ambulatory Visit: Payer: Self-pay

## 2020-10-09 ENCOUNTER — Ambulatory Visit (INDEPENDENT_AMBULATORY_CARE_PROVIDER_SITE_OTHER): Payer: Medicare HMO | Admitting: Ophthalmology

## 2020-10-09 ENCOUNTER — Encounter (INDEPENDENT_AMBULATORY_CARE_PROVIDER_SITE_OTHER): Payer: Self-pay | Admitting: Ophthalmology

## 2020-10-09 DIAGNOSIS — H34832 Tributary (branch) retinal vein occlusion, left eye, with macular edema: Secondary | ICD-10-CM | POA: Diagnosis not present

## 2020-10-09 DIAGNOSIS — H2511 Age-related nuclear cataract, right eye: Secondary | ICD-10-CM

## 2020-10-09 DIAGNOSIS — H2512 Age-related nuclear cataract, left eye: Secondary | ICD-10-CM

## 2020-10-09 NOTE — Progress Notes (Signed)
10/09/2020     CHIEF COMPLAINT Patient presents for Retina Follow Up   HISTORY OF PRESENT ILLNESS: [name] is a 74 y.o. female who presents to the clinic today for:   HPI    Retina Follow Up    Patient presents with  CRVO/BRVO.  In left eye.  Severity is moderate.  Duration of 6 months.  Since onset it is stable.  I, the attending physician,  performed the HPI with the patient and updated documentation appropriately.          Comments    6 Month BRVO f\u OS. OCT  Pt states vision has been stable. Denies any complaints.       Last edited by Elyse Jarvis on 10/09/2020  1:06 PM. (History)      Referring physician: Merri Brunette, MD [address] [place],  [place] [address]  HISTORICAL INFORMATION:   Selected notes from the MEDICAL RECORD NUMBER       CURRENT MEDICATIONS: Current Outpatient Medications (Ophthalmic Drugs)  Medication Sig  . cycloSPORINE (RESTASIS) 0.05 % ophthalmic emulsion Place 1 drop into both eyes 2 (two) times daily.   No current facility-administered medications for this visit. (Ophthalmic Drugs)   Current Outpatient Medications (Other)  Medication Sig  . amLODipine (NORVASC) 5 MG tablet Take 2.5 mg by mouth daily.  . cetirizine (ZYRTEC) 10 MG tablet Take 10 mg by mouth as needed.   . Cholecalciferol (VITAMIN D3) 2000 units TABS Take 2,000 Units by mouth daily.  Marland Kitchen denosumab (PROLIA) 60 MG/ML SOLN injection Inject 60 mg into the skin every 6 (six) months. Administer in upper arm, thigh, or abdomen  . metoprolol succinate (TOPROL XL) 50 MG 24 hr tablet Take 1 tablet (50 mg total) by mouth daily.  . metoprolol succinate (TOPROL-XL) 25 MG 24 hr tablet Take 1 tablet (25 mg total) by mouth daily. Take with or immediately following a meal.  . metroNIDAZOLE (METROGEL) 0.75 % gel Apply 1 application topically 2 (two) times daily. Pt applies to face.  . Multiple Vitamins-Calcium (ONE-A-DAY WOMENS PO) Take 1 tablet daily by mouth.   Marland Kitchen rOPINIRole (REQUIP) 0.5 MG tablet Take 0.25 mg at bedtime by mouth.  . telmisartan (MICARDIS) 80 MG tablet Take 80 mg by mouth daily.   No current facility-administered medications for this visit. (Other)      REVIEW OF SYSTEMS:    ALLERGIES No Known Allergies  PAST MEDICAL HISTORY Past Medical History:  Diagnosis Date  . Hypertension   . Macular edema   . Osteoporosis    Past Surgical History:  Procedure Laterality Date  . ABDOMINAL ADHESION SURGERY  2009   Dr. Colin Benton.  Malrotation - LOA done  . ABDOMINAL HYSTERECTOMY    . APPENDECTOMY    . LAPAROTOMY N/A 07/13/2017   Procedure: EXPLORATORY LAPAROTOMY, ENTEROLYSIS;  Surgeon: Luretha Murphy, MD;  Location: [facility];  Service: General;  Laterality: N/A;    FAMILY HISTORY Family History  Problem Relation Age of Onset  . Stroke Mother   . Hypertension Mother   . Osteoarthritis Mother     SOCIAL HISTORY Social History   Tobacco Use  . Smoking status: Never Smoker  . Smokeless tobacco: Never Used  Substance Use Topics  . Alcohol use: Yes    Alcohol/week: 1.0 standard drink    Types: 1 Glasses of wine per week    Comment: daily  . Drug use: No         OPHTHALMIC EXAM:  Base Eye Exam    Visual Acuity (Snellen - Linear)      Right Left   Dist [place] 20/20 -2 20/25 -1       Tonometry (Tonopen, 1:10 PM)      Right Left   Pressure 16 18       Pupils      Pupils Dark Light Shape React APD   Right PERRL 3 2 Round Brisk None   Left PERRL 3 2 Round Brisk None       Visual Fields (Counting fingers)      Left Right    Full Full       Neuro/Psych    Oriented x3: Yes   Mood/Affect: Normal       Dilation    Both eyes: 1.0% Mydriacyl, 2.5% Phenylephrine @ 1:10 PM        Slit Lamp and Fundus Exam    External Exam      Right Left   External Normal Normal       Slit Lamp Exam      Right Left   Lids/Lashes Normal Normal   Conjunctiva/Sclera White and quiet White and quiet   Cornea Clear Clear     Anterior Chamber Deep and quiet Deep and quiet   Iris Round and reactive Round and reactive   Lens 2+ Nuclear sclerosis 3+ Nuclear sclerosis   Anterior Vitreous Normal Normal       Fundus Exam      Right Left   Posterior Vitreous Normal Normal   Disc Normal Superior pole of nerve has shunt, collateralization.  No N/V.   C/D Ratio 0.1 0.3   Macula Normal Microaneurysms, with focal retinal thickening and focal exudates temporally.   Vessels Normal Tortuous,, with macular branch retinal vein occlusion   Periphery Normal Normal          IMAGING AND PROCEDURES  Imaging and Procedures for 10/09/20  OCT, Retina - OU - Both Eyes       Right Eye Quality was good. Scan locations included subfoveal. Central Foveal Thickness: 299. Progression has been stable.   Left Eye Quality was good. Scan locations included subfoveal. Central Foveal Thickness: 360. Progression has improved. Findings include cystoid macular edema.   Notes Much less CME OS from branch retinal vein occlusion.  Minor epiretinal membrane OS.  We will continue to observe                ASSESSMENT/PLAN:  Branch retinal vein occlusion with macular edema of left eye Retinal vein occlusion of the left eye, chronic in the past and with significant center involved macular thickening.  Now only with minor Perifoveal macular thickening temporal to the left eye and continued slow improvement over time.  Currently at 69-month follow-up interval.  We will continue to monitor and return 5 to 6 months from now  Nuclear sclerotic cataract of left eye Cataract surgery at either eye at any time may be undertaken.  I explained the nature of dense nuclear sclerotic color change in each eye and the easier chance of successful uncomplicated removal at this stage of nuclear sclerosis.  Patient indicates she will follow up with Dr[facility]  Nuclear sclerotic cataract of right eye See comments regarding this  condition above with the left eye      ICD-10-CM   1. Branch retinal vein occlusion with macular edema of left eye  H34.8320 OCT, Retina - OU - Both Eyes  2. Nuclear  sclerotic cataract of left eye  H25.12   3. Nuclear sclerotic cataract of right eye  H25.11     1.  Mild CME OS from macular branch retinal vein occlusion, no progression in fact still slightly improving likely from collateralization of the blockage.  We will continue to observe.  2.  Patient to return to see Dr. Elmer Picker, of [facility] eye care.  3.  Ophthalmic Meds Ordered this visit:  No orders of the defined types were placed in this encounter.      Return in about 6 months (around 04/09/2021) for DILATE OU, OCT.  There are no Patient Instructions on file for this visit.   Explained the diagnoses, plan, and follow up with the patient and they expressed understanding.  Patient expressed understanding of the importance of proper follow up care.   Alford Highland Anesa Fronek M.D. Diseases & Surgery of the Retina and Vitreous [facility] 10/09/20     Abbreviations: M myopia (nearsighted); A astigmatism; H hyperopia (farsighted); P presbyopia; Mrx spectacle prescription;  CTL contact lenses; OD right eye; OS left eye; OU both eyes  XT exotropia; ET esotropia; PEK punctate epithelial keratitis; PEE punctate epithelial erosions; DES dry eye syndrome; MGD meibomian gland dysfunction; ATs artificial tears; PFAT's preservative free artificial tears; NSC nuclear sclerotic cataract; PSC posterior subcapsular cataract; ERM epi-retinal membrane; PVD posterior vitreous detachment; RD retinal detachment; DM diabetes mellitus; DR diabetic retinopathy; NPDR non-proliferative diabetic retinopathy; PDR proliferative diabetic retinopathy; CSME clinically significant macular edema; DME diabetic macular edema; dbh dot blot hemorrhages; CWS cotton wool spot; POAG primary open angle glaucoma; C/D cup-to-disc ratio; HVF humphrey visual  field; GVF goldmann visual field; OCT optical coherence tomography; IOP intraocular pressure; BRVO Branch retinal vein occlusion; CRVO central retinal vein occlusion; CRAO central retinal artery occlusion; BRAO branch retinal artery occlusion; RT retinal tear; SB scleral buckle; PPV pars plana vitrectomy; VH Vitreous hemorrhage; PRP panretinal laser photocoagulation; IVK intravitreal kenalog; VMT vitreomacular traction; MH Macular hole;  NVD neovascularization of the disc; NVE neovascularization elsewhere; AREDS age related eye disease study; ARMD age related macular degeneration; POAG primary open angle glaucoma; EBMD epithelial/anterior basement membrane dystrophy; ACIOL anterior chamber intraocular lens; IOL intraocular lens; PCIOL posterior chamber intraocular lens; Phaco/IOL phacoemulsification with intraocular lens placement; PRK photorefractive keratectomy; LASIK laser assisted in situ keratomileusis; HTN hypertension; DM diabetes mellitus; COPD chronic obstructive pulmonary disease

## 2020-10-09 NOTE — Assessment & Plan Note (Signed)
See comments regarding this condition above with the left eye

## 2020-10-09 NOTE — Assessment & Plan Note (Signed)
Retinal vein occlusion of the left eye, chronic in the past and with significant center involved macular thickening.  Now only with minor Perifoveal macular thickening temporal to the left eye and continued slow improvement over time.  Currently at 71-month follow-up interval.  We will continue to monitor and return 5 to 6 months from now

## 2020-10-09 NOTE — Assessment & Plan Note (Signed)
Cataract surgery at either eye at any time may be undertaken.  I explained the nature of dense nuclear sclerotic color change in each eye and the easier chance of successful uncomplicated removal at this stage of nuclear sclerosis.  Patient indicates she will follow up with Dr. Elmer Picker [facility]

## 2020-10-16 ENCOUNTER — Other Ambulatory Visit (HOSPITAL_BASED_OUTPATIENT_CLINIC_OR_DEPARTMENT_OTHER): Payer: Self-pay | Admitting: Internal Medicine

## 2020-10-16 ENCOUNTER — Ambulatory Visit: Payer: Medicare HMO | Attending: Internal Medicine

## 2020-10-16 DIAGNOSIS — Z23 Encounter for immunization: Secondary | ICD-10-CM

## 2020-10-16 NOTE — Progress Notes (Signed)
   Covid-19 Vaccination Clinic  Name:  Lakrista Scaduto    MRN: [id number] DOB: [birth year]  10/16/2020  Ms. Kallen was observed post Covid-19 immunization for 15 minutes without incident. She was provided with Vaccine Information Sheet and instruction to access the V-Safe system. Vaccinated by Maryella Shivers.  Ms. Whetstone was instructed to call 911 with any severe reactions post vaccine: Marland Kitchen Difficulty breathing  . Swelling of face and throat  . A fast heartbeat  . A bad rash all over body  . Dizziness and weakness

## 2020-10-20 MED FILL — PFIZER-BIONTECH COVID-19 VA: 30 | 1 days supply | Qty: 0 | Fill #0

## 2020-10-23 DIAGNOSIS — H52213 Irregular astigmatism, bilateral: Secondary | ICD-10-CM | POA: Diagnosis not present

## 2020-10-23 DIAGNOSIS — H2513 Age-related nuclear cataract, bilateral: Secondary | ICD-10-CM | POA: Diagnosis not present

## 2020-10-23 DIAGNOSIS — H35363 Drusen (degenerative) of macula, bilateral: Secondary | ICD-10-CM | POA: Diagnosis not present

## 2020-10-23 DIAGNOSIS — H34832 Tributary (branch) retinal vein occlusion, left eye, with macular edema: Secondary | ICD-10-CM | POA: Diagnosis not present

## 2020-10-23 DIAGNOSIS — H25013 Cortical age-related cataract, bilateral: Secondary | ICD-10-CM | POA: Diagnosis not present

## 2020-10-23 DIAGNOSIS — H2589 Other age-related cataract: Secondary | ICD-10-CM | POA: Diagnosis not present

## 2020-10-23 DIAGNOSIS — H2512 Age-related nuclear cataract, left eye: Secondary | ICD-10-CM | POA: Diagnosis not present

## 2020-10-23 DIAGNOSIS — H40013 Open angle with borderline findings, low risk, bilateral: Secondary | ICD-10-CM | POA: Diagnosis not present

## 2020-12-04 ENCOUNTER — Other Ambulatory Visit: Payer: Self-pay

## 2020-12-04 ENCOUNTER — Ambulatory Visit
Admission: RE | Admit: 2020-12-04 | Discharge: 2020-12-04 | Disposition: A | Discharge status: Home / Self Care | Payer: Medicare HMO | Source: Ambulatory Visit | Attending: Internal Medicine | Admitting: Internal Medicine

## 2020-12-04 DIAGNOSIS — Z1231 Encounter for screening mammogram for malignant neoplasm of breast: Secondary | ICD-10-CM

## 2020-12-11 ENCOUNTER — Other Ambulatory Visit: Payer: Self-pay | Admitting: Cardiovascular Disease

## 2021-01-02 DIAGNOSIS — H21562 Pupillary abnormality, left eye: Secondary | ICD-10-CM | POA: Diagnosis not present

## 2021-01-02 DIAGNOSIS — H2512 Age-related nuclear cataract, left eye: Secondary | ICD-10-CM | POA: Diagnosis not present

## 2021-01-02 DIAGNOSIS — H5703 Miosis: Secondary | ICD-10-CM | POA: Diagnosis not present

## 2021-01-02 DIAGNOSIS — H25812 Combined forms of age-related cataract, left eye: Secondary | ICD-10-CM | POA: Diagnosis not present

## 2021-01-24 ENCOUNTER — Encounter (INDEPENDENT_AMBULATORY_CARE_PROVIDER_SITE_OTHER): Payer: Self-pay

## 2021-04-03 DIAGNOSIS — M810 Age-related osteoporosis without current pathological fracture: Secondary | ICD-10-CM | POA: Diagnosis not present

## 2021-04-09 ENCOUNTER — Encounter (INDEPENDENT_AMBULATORY_CARE_PROVIDER_SITE_OTHER): Payer: Self-pay | Admitting: Ophthalmology

## 2021-04-09 ENCOUNTER — Ambulatory Visit (INDEPENDENT_AMBULATORY_CARE_PROVIDER_SITE_OTHER): Payer: Medicare HMO | Admitting: Ophthalmology

## 2021-04-09 ENCOUNTER — Other Ambulatory Visit: Payer: Self-pay

## 2021-04-09 DIAGNOSIS — H2511 Age-related nuclear cataract, right eye: Secondary | ICD-10-CM | POA: Diagnosis not present

## 2021-04-09 DIAGNOSIS — H34832 Tributary (branch) retinal vein occlusion, left eye, with macular edema: Secondary | ICD-10-CM

## 2021-04-09 NOTE — Assessment & Plan Note (Signed)
CME secondary to BRVO has increased, temporally as well as now into the center involvement.  We will need to resume antivegF.  We will need to order and have intravitreal Eylea ready upon her return

## 2021-04-09 NOTE — Progress Notes (Signed)
04/09/2021     CHIEF COMPLAINT Patient presents for Retina Follow Up (14mo fu OU/OCT//Pt states,"I feel like my va is better. I had Cat sx in Jan and that went well."//Pt states VA OU stable since last visit. Pt denies FOL, floaters, or ocular pain OU.  /Pt report still using Restasis OU BID)   HISTORY OF PRESENT ILLNESS: [name] is a 75 y.o. female who presents to the clinic today for:   HPI    Retina Follow Up    Patient presents with  CRVO/BRVO.  In left eye.  This started 6 months ago.  Severity is mild.  Duration of 6 months.  Since onset it is stable. Additional comments: 38mo fu OU/OCT  Pt states,"I feel like my va is better. I had Cat sx in Jan and that went well."  Pt states VA OU stable since last visit. Pt denies FOL, floaters, or ocular pain OU.   Pt report still using Restasis OU BID       Last edited by Demetrios Loll, COA on 04/09/2021  1:03 PM. (History)      Referring physician: Merri Brunette, MD [address] [place],  [place] [address]  HISTORICAL INFORMATION:   Selected notes from the MEDICAL RECORD NUMBER       CURRENT MEDICATIONS: Current Outpatient Medications (Ophthalmic Drugs)  Medication Sig  . cycloSPORINE (RESTASIS) 0.05 % ophthalmic emulsion Place 1 drop into both eyes 2 (two) times daily.   No current facility-administered medications for this visit. (Ophthalmic Drugs)   Current Outpatient Medications (Other)  Medication Sig  . amLODipine (NORVASC) 5 MG tablet Take 2.5 mg by mouth daily.  . cetirizine (ZYRTEC) 10 MG tablet Take 10 mg by mouth as needed.   . Cholecalciferol (VITAMIN D3) 2000 units TABS Take 2,000 Units by mouth daily.  Marland Kitchen COVID-19 mRNA vaccine, Pfizer, 30 MCG/0.3ML injection INJECT AS DIRECTED  . denosumab (PROLIA) 60 MG/ML SOLN injection Inject 60 mg into the skin every 6 (six) months. Administer in upper arm, thigh, or abdomen  . metoprolol succinate (TOPROL-XL) 25 MG 24 hr tablet Take 1 tablet (25 mg  total) by mouth daily. Take with or immediately following a meal.  . metoprolol succinate (TOPROL-XL) 50 MG 24 hr tablet TAKE 1 TABLET BY MOUTH EVERY DAY  . metroNIDAZOLE (METROGEL) 0.75 % gel Apply 1 application topically 2 (two) times daily. Pt applies to face.  . Multiple Vitamins-Calcium (ONE-A-DAY WOMENS PO) Take 1 tablet daily by mouth.  Marland Kitchen rOPINIRole (REQUIP) 0.5 MG tablet Take 0.25 mg at bedtime by mouth.  . telmisartan (MICARDIS) 80 MG tablet Take 80 mg by mouth daily.   No current facility-administered medications for this visit. (Other)      REVIEW OF SYSTEMS:    ALLERGIES Not on File  PAST MEDICAL HISTORY Past Medical History:  Diagnosis Date  . Hypertension   . Macular edema   . Nuclear sclerotic cataract of left eye 03/27/2020  . Osteoporosis    Past Surgical History:  Procedure Laterality Date  . ABDOMINAL ADHESION SURGERY  2009   Dr. Colin Benton.  Malrotation - LOA done  . ABDOMINAL HYSTERECTOMY    . APPENDECTOMY    . LAPAROTOMY N/A 07/13/2017   Procedure: EXPLORATORY LAPAROTOMY, ENTEROLYSIS;  Surgeon: Luretha Murphy, MD;  Location: [facility];  Service: General;  Laterality: N/A;    FAMILY HISTORY Family History  Problem Relation Age of Onset  . Stroke Mother   . Hypertension Mother   .  Osteoarthritis Mother     SOCIAL HISTORY Social History   Tobacco Use  . Smoking status: Never Smoker  . Smokeless tobacco: Never Used  Substance Use Topics  . Alcohol use: Yes    Alcohol/week: 1.0 standard drink    Types: 1 Glasses of wine per week    Comment: daily  . Drug use: No         OPHTHALMIC EXAM: Base Eye Exam    Visual Acuity (ETDRS)      Right Left   Dist [place] 20/25 -1 20/40 -2   Dist ph [place]  20/30       Tonometry (Tonopen, 1:08 PM)      Right Left   Pressure 16 16       Pupils      Pupils Dark Light Shape React APD   Right PERRL 3 2 Round Brisk None   Left PERRL 3 2 Round Brisk None       Visual Fields (Counting fingers)      Left Right      Full       Neuro/Psych    Oriented x3: Yes   Mood/Affect: Normal       Dilation    Both eyes: 1.0% Mydriacyl, 2.5% Phenylephrine @ 1:08 PM        Slit Lamp and Fundus Exam    External Exam      Right Left   External Normal Normal       Slit Lamp Exam      Right Left   Lids/Lashes Normal Normal   Conjunctiva/Sclera White and quiet White and quiet   Cornea Clear Clear   Anterior Chamber Deep and quiet Deep and quiet   Iris Round and reactive Round and reactive   Lens 2+ Nuclear sclerosis Centered posterior chamber intraocular lens   Anterior Vitreous Normal Normal       Fundus Exam      Right Left   Posterior Vitreous Normal Normal   Disc Normal Superior pole of nerve has shunt, collateralization.  No N/V.   C/D Ratio 0.1 0.3   Macula Normal Microaneurysms, with focal retinal thickening and focal exudates temporally.   Vessels Normal Tortuous,, with macular branch retinal vein occlusion   Periphery Normal Normal          IMAGING AND PROCEDURES  Imaging and Procedures for 04/09/21  OCT, Retina - OU - Both Eyes       Right Eye Quality was good. Scan locations included subfoveal. Central Foveal Thickness: 296. Progression has been stable. Findings include normal foveal contour.   Left Eye Quality was good. Scan locations included subfoveal. Central Foveal Thickness: 383. Progression has worsened. Findings include cystoid macular edema.   Notes Much less CME OS from branch retinal vein occlusion.  Minor epiretinal membrane OS.  We will continue to observe  Now with CME encroaching into the center involvement left eye                ASSESSMENT/PLAN:  Branch retinal vein occlusion with macular edema of left eye CME secondary to BRVO has increased, temporally as well as now into the center involvement.  We will need to resume antivegF.  We will need to order and have intravitreal Eylea ready upon her return  Nuclear sclerotic cataract of right  eye Moderate cataract in the right eye being followed with Dr. Delrae Sawyers.  Consideration of progression to surgery at any time to balance the eyes or as per  media opacity when it impacts upon her vision      ICD-10-CM   1. Branch retinal vein occlusion with macular edema of left eye  H34.8320 OCT, Retina - OU - Both Eyes  2. Nuclear sclerotic cataract of right eye  H25.11     1.  OS we will restart antivegF therapy in approximately 2 weeks with vitreal Eylea 2.  3.  Ophthalmic Meds Ordered this visit:  No orders of the defined types were placed in this encounter.      Return in about 2 weeks (around 04/23/2021) for EYLEA OCT, OS.  There are no Patient Instructions on file for this visit.   Explained the diagnoses, plan, and follow up with the patient and they expressed understanding.  Patient expressed understanding of the importance of proper follow up care.   Alford Highland Maresha Anastos M.D. Diseases & Surgery of the Retina and Vitreous [facility] 04/09/21     Abbreviations: M myopia (nearsighted); A astigmatism; H hyperopia (farsighted); P presbyopia; Mrx spectacle prescription;  CTL contact lenses; OD right eye; OS left eye; OU both eyes  XT exotropia; ET esotropia; PEK punctate epithelial keratitis; PEE punctate epithelial erosions; DES dry eye syndrome; MGD meibomian gland dysfunction; ATs artificial tears; PFAT's preservative free artificial tears; NSC nuclear sclerotic cataract; PSC posterior subcapsular cataract; ERM epi-retinal membrane; PVD posterior vitreous detachment; RD retinal detachment; DM diabetes mellitus; DR diabetic retinopathy; NPDR non-proliferative diabetic retinopathy; PDR proliferative diabetic retinopathy; CSME clinically significant macular edema; DME diabetic macular edema; dbh dot blot hemorrhages; CWS cotton wool spot; POAG primary open angle glaucoma; C/D cup-to-disc ratio; HVF humphrey visual field; GVF goldmann visual field; OCT optical  coherence tomography; IOP intraocular pressure; BRVO Branch retinal vein occlusion; CRVO central retinal vein occlusion; CRAO central retinal artery occlusion; BRAO branch retinal artery occlusion; RT retinal tear; SB scleral buckle; PPV pars plana vitrectomy; VH Vitreous hemorrhage; PRP panretinal laser photocoagulation; IVK intravitreal kenalog; VMT vitreomacular traction; MH Macular hole;  NVD neovascularization of the disc; NVE neovascularization elsewhere; AREDS age related eye disease study; ARMD age related macular degeneration; POAG primary open angle glaucoma; EBMD epithelial/anterior basement membrane dystrophy; ACIOL anterior chamber intraocular lens; IOL intraocular lens; PCIOL posterior chamber intraocular lens; Phaco/IOL phacoemulsification with intraocular lens placement; PRK photorefractive keratectomy; LASIK laser assisted in situ keratomileusis; HTN hypertension; DM diabetes mellitus; COPD chronic obstructive pulmonary disease

## 2021-04-09 NOTE — Assessment & Plan Note (Signed)
Moderate cataract in the right eye being followed with Dr. Delrae Sawyers.  Consideration of progression to surgery at any time to balance the eyes or as per media opacity when it impacts upon her vision

## 2021-04-24 ENCOUNTER — Encounter (INDEPENDENT_AMBULATORY_CARE_PROVIDER_SITE_OTHER): Payer: Medicare HMO | Admitting: Ophthalmology

## 2021-04-25 ENCOUNTER — Other Ambulatory Visit: Payer: Self-pay

## 2021-04-25 ENCOUNTER — Ambulatory Visit (INDEPENDENT_AMBULATORY_CARE_PROVIDER_SITE_OTHER): Payer: Medicare HMO | Admitting: Ophthalmology

## 2021-04-25 ENCOUNTER — Encounter (INDEPENDENT_AMBULATORY_CARE_PROVIDER_SITE_OTHER): Payer: Self-pay | Admitting: Ophthalmology

## 2021-04-25 ENCOUNTER — Encounter (INDEPENDENT_AMBULATORY_CARE_PROVIDER_SITE_OTHER): Payer: Medicare HMO | Admitting: Ophthalmology

## 2021-04-25 DIAGNOSIS — H34832 Tributary (branch) retinal vein occlusion, left eye, with macular edema: Secondary | ICD-10-CM | POA: Diagnosis not present

## 2021-04-25 DIAGNOSIS — Z961 Presence of intraocular lens: Secondary | ICD-10-CM

## 2021-04-25 DIAGNOSIS — H35352 Cystoid macular degeneration, left eye: Secondary | ICD-10-CM | POA: Diagnosis not present

## 2021-04-25 MED ORDER — AFLIBERCEPT 2MG/0.05ML IZ SOLN FOR KALEIDOSCOPE
2.0000 mg | INTRAVITREAL | Status: AC | PRN
  Administered 2021-04-25: 2 mg via INTRAVITREAL

## 2021-04-25 NOTE — Progress Notes (Signed)
04/25/2021     CHIEF COMPLAINT Patient presents for Retina Follow Up (2wk fu OU Eylea OS//Pt states [facility] OU stable since last visit. Pt denies FOL, floaters, or ocular pain OU.  )   HISTORY OF PRESENT ILLNESS: [name] is a 75 y.o. female who presents to the clinic today for:   HPI    Retina Follow Up    Diagnosis: CRVO/BRVO   Laterality: left eye   Onset: 2 weeks ago   Severity: mild   Duration: 2 weeks   Course: stable   Comments: 2wk fu OU Eylea OS  Pt states VA OU stable since last visit. Pt denies FOL, floaters, or ocular pain OU.         Last edited by Demetrios Loll, COA on 04/25/2021  3:22 PM. (History)      Referring physician: Merri Brunette, MD [address] [place],  [place] [address]  HISTORICAL INFORMATION:   Selected notes from the MEDICAL RECORD NUMBER       CURRENT MEDICATIONS: Current Outpatient Medications (Ophthalmic Drugs)  Medication Sig  . cycloSPORINE (RESTASIS) 0.05 % ophthalmic emulsion Place 1 drop into both eyes 2 (two) times daily.   No current facility-administered medications for this visit. (Ophthalmic Drugs)   Current Outpatient Medications (Other)  Medication Sig  . amLODipine (NORVASC) 5 MG tablet Take 2.5 mg by mouth daily.  . cetirizine (ZYRTEC) 10 MG tablet Take 10 mg by mouth as needed.   . Cholecalciferol (VITAMIN D3) 2000 units TABS Take 2,000 Units by mouth daily.  Marland Kitchen COVID-19 mRNA vaccine, Pfizer, 30 MCG/0.3ML injection INJECT AS DIRECTED  . denosumab (PROLIA) 60 MG/ML SOLN injection Inject 60 mg into the skin every 6 (six) months. Administer in upper arm, thigh, or abdomen  . metoprolol succinate (TOPROL-XL) 25 MG 24 hr tablet Take 1 tablet (25 mg total) by mouth daily. Take with or immediately following a meal.  . metoprolol succinate (TOPROL-XL) 50 MG 24 hr tablet TAKE 1 TABLET BY MOUTH EVERY DAY  . metroNIDAZOLE (METROGEL) 0.75 % gel Apply 1 application topically 2 (two) times daily. Pt applies to face.   . Multiple Vitamins-Calcium (ONE-A-DAY WOMENS PO) Take 1 tablet daily by mouth.  Marland Kitchen rOPINIRole (REQUIP) 0.5 MG tablet Take 0.25 mg at bedtime by mouth.  . telmisartan (MICARDIS) 80 MG tablet Take 80 mg by mouth daily.   No current facility-administered medications for this visit. (Other)      REVIEW OF SYSTEMS:    ALLERGIES Not on File  PAST MEDICAL HISTORY Past Medical History:  Diagnosis Date  . Hypertension   . Macular edema   . Nuclear sclerotic cataract of left eye 03/27/2020  . Osteoporosis    Past Surgical History:  Procedure Laterality Date  . ABDOMINAL ADHESION SURGERY  2009   Dr. Colin Benton.  Malrotation - LOA done  . ABDOMINAL HYSTERECTOMY    . APPENDECTOMY    . LAPAROTOMY N/A 07/13/2017   Procedure: EXPLORATORY LAPAROTOMY, ENTEROLYSIS;  Surgeon: Luretha Murphy, MD;  Location: [facility];  Service: General;  Laterality: N/A;    FAMILY HISTORY Family History  Problem Relation Age of Onset  . Stroke Mother   . Hypertension Mother   . Osteoarthritis Mother     SOCIAL HISTORY Social History   Tobacco Use  . Smoking status: Never Smoker  . Smokeless tobacco: Never Used  Substance Use Topics  . Alcohol use: Yes    Alcohol/week: 1.0 standard drink    Types: 1 Glasses  of wine per week    Comment: daily  . Drug use: No         OPHTHALMIC EXAM: Base Eye Exam    Visual Acuity (ETDRS)      Right Left   Dist [place] 20/30 20/40   Dist ph [place] 20/20 20/30 -1       Tonometry (Tonopen, 3:27 PM)      Right Left   Pressure 17 17       Pupils      Dark Light Shape React APD   Right 3 2 Round Brisk None   Left 3 2 Round Brisk None       Visual Fields (Counting fingers)      Left Right    Full Full       Extraocular Movement      Right Left    Full Full       Neuro/Psych    Oriented x3: Yes   Mood/Affect: Normal       Dilation    Left eye: 1.0% Mydriacyl, 2.5% Phenylephrine @ 3:27 PM        Slit Lamp and Fundus Exam    External Exam      Right  Left   External Normal Normal       Slit Lamp Exam      Right Left   Lids/Lashes Normal Normal   Conjunctiva/Sclera White and quiet White and quiet   Cornea Clear Clear   Anterior Chamber Deep and quiet Deep and quiet   Iris Round and reactive Round and reactive   Lens 2+ Nuclear sclerosis Centered posterior chamber intraocular lens   Anterior Vitreous Normal Normal       Fundus Exam      Right Left   Posterior Vitreous Normal Normal   Disc Normal Superior pole of nerve has shunt, collateralization.  No N/V.   C/D Ratio 0.1 0.3   Macula Normal Microaneurysms, with focal retinal thickening and focal exudates temporally.   Vessels Normal Tortuous,, with macular branch retinal vein occlusion   Periphery Normal Normal          IMAGING AND PROCEDURES  Imaging and Procedures for 04/25/21  OCT, Retina - OU - Both Eyes       Right Eye Quality was good. Scan locations included subfoveal. Central Foveal Thickness: 303. Progression has been stable.   Left Eye Quality was good. Scan locations included subfoveal. Central Foveal Thickness: 392. Progression has worsened. Findings include abnormal foveal contour, cystoid macular edema.        Intravitreal Injection, Pharmacologic Agent - OS - Left Eye       Time Out 04/25/2021. 4:15 PM. Confirmed correct patient, procedure, site, and patient consented.   Anesthesia Topical anesthesia was used.   Procedure Preparation included Tobramycin 0.3%, Ofloxacin . A 30 gauge needle was used.   Injection:  2 mg aflibercept Gretta Cool) SOLN   NDC: L6038910, Lot: [id number]   Route: Intravitreal, Site: Left Eye, Waste: 0 mg  Post-op Post injection exam found visual acuity of at least counting fingers. The patient tolerated the procedure well. There were no complications. The patient received written and verbal post procedure care education. Post injection medications were not given.                 ASSESSMENT/PLAN:  Branch  retinal vein occlusion with macular edema of left eye Branch retinal vein occlusion with secondary increase of CME now encroaching upon the central vision.  We  will reinstitute therapy today with intravitreal Eylea and follow-up examination again in 7 weeks  Cystoid macular edema of left eye CME secondary to BRVO, will continue to observe and look for resolution with treatment of condition with Eylea  Pseudophakia of left eye Clear media no opacifications, observe      ICD-10-CM   1. Branch retinal vein occlusion with macular edema of left eye  H34.8320 OCT, Retina - OU - Both Eyes    Intravitreal Injection, Pharmacologic Agent - OS - Left Eye    aflibercept (EYLEA) SOLN 2 mg  2. Cystoid macular edema of left eye  H35.352   3. Pseudophakia of left eye  Z96.1     1.  Branch retinal vein occlusion with cystoid macular edema left eye.  Recurrence, will treat today with Eylea and follow-up in 7 weeks  2.  3.  Ophthalmic Meds Ordered this visit:  Meds ordered this encounter  Medications  . aflibercept (EYLEA) SOLN 2 mg       Return in about 7 weeks (around 06/13/2021) for dilate, OS, EYLEA OCT.  There are no Patient Instructions on file for this visit.   Explained the diagnoses, plan, and follow up with the patient and they expressed understanding.  Patient expressed understanding of the importance of proper follow up care.   Alford Highland Audelia Knape M.D. Diseases & Surgery of the Retina and Vitreous [facility] 04/25/21     Abbreviations: M myopia (nearsighted); A astigmatism; H hyperopia (farsighted); P presbyopia; Mrx spectacle prescription;  CTL contact lenses; OD right eye; OS left eye; OU both eyes  XT exotropia; ET esotropia; PEK punctate epithelial keratitis; PEE punctate epithelial erosions; DES dry eye syndrome; MGD meibomian gland dysfunction; ATs artificial tears; PFAT's preservative free artificial tears; NSC nuclear sclerotic cataract; PSC posterior  subcapsular cataract; ERM epi-retinal membrane; PVD posterior vitreous detachment; RD retinal detachment; DM diabetes mellitus; DR diabetic retinopathy; NPDR non-proliferative diabetic retinopathy; PDR proliferative diabetic retinopathy; CSME clinically significant macular edema; DME diabetic macular edema; dbh dot blot hemorrhages; CWS cotton wool spot; POAG primary open angle glaucoma; C/D cup-to-disc ratio; HVF humphrey visual field; GVF goldmann visual field; OCT optical coherence tomography; IOP intraocular pressure; BRVO Branch retinal vein occlusion; CRVO central retinal vein occlusion; CRAO central retinal artery occlusion; BRAO branch retinal artery occlusion; RT retinal tear; SB scleral buckle; PPV pars plana vitrectomy; VH Vitreous hemorrhage; PRP panretinal laser photocoagulation; IVK intravitreal kenalog; VMT vitreomacular traction; MH Macular hole;  NVD neovascularization of the disc; NVE neovascularization elsewhere; AREDS age related eye disease study; ARMD age related macular degeneration; POAG primary open angle glaucoma; EBMD epithelial/anterior basement membrane dystrophy; ACIOL anterior chamber intraocular lens; IOL intraocular lens; PCIOL posterior chamber intraocular lens; Phaco/IOL phacoemulsification with intraocular lens placement; PRK photorefractive keratectomy; LASIK laser assisted in situ keratomileusis; HTN hypertension; DM diabetes mellitus; COPD chronic obstructive pulmonary disease

## 2021-04-25 NOTE — Assessment & Plan Note (Signed)
Branch retinal vein occlusion with secondary increase of CME now encroaching upon the central vision.  We will reinstitute therapy today with intravitreal Eylea and follow-up examination again in 7 weeks

## 2021-04-25 NOTE — Assessment & Plan Note (Signed)
Clear media no opacifications, observe

## 2021-04-25 NOTE — Assessment & Plan Note (Signed)
CME secondary to BRVO, will continue to observe and look for resolution with treatment of condition with [facility]

## 2021-05-28 ENCOUNTER — Other Ambulatory Visit: Payer: Self-pay | Admitting: Cardiovascular Disease

## 2021-06-12 ENCOUNTER — Ambulatory Visit (INDEPENDENT_AMBULATORY_CARE_PROVIDER_SITE_OTHER): Payer: Medicare HMO | Admitting: Ophthalmology

## 2021-06-12 ENCOUNTER — Other Ambulatory Visit: Payer: Self-pay

## 2021-06-12 ENCOUNTER — Encounter (INDEPENDENT_AMBULATORY_CARE_PROVIDER_SITE_OTHER): Payer: Medicare HMO | Admitting: Ophthalmology

## 2021-06-12 ENCOUNTER — Encounter (INDEPENDENT_AMBULATORY_CARE_PROVIDER_SITE_OTHER): Payer: Self-pay | Admitting: Ophthalmology

## 2021-06-12 DIAGNOSIS — H34832 Tributary (branch) retinal vein occlusion, left eye, with macular edema: Secondary | ICD-10-CM | POA: Diagnosis not present

## 2021-06-12 DIAGNOSIS — H2511 Age-related nuclear cataract, right eye: Secondary | ICD-10-CM | POA: Diagnosis not present

## 2021-06-12 DIAGNOSIS — H35352 Cystoid macular degeneration, left eye: Secondary | ICD-10-CM

## 2021-06-12 MED ORDER — AFLIBERCEPT 2MG/0.05ML IZ SOLN FOR KALEIDOSCOPE
2.0000 mg | INTRAVITREAL | Status: AC | PRN
  Administered 2021-06-12: 2 mg via INTRAVITREAL

## 2021-06-12 NOTE — Assessment & Plan Note (Signed)
CME perifoveal OS improved at 7-week interval post injection intravitreal Eylea for macular BRVO, recurrent CME.  Repeat injection OS today and maintain 6 to 7-week interval

## 2021-06-12 NOTE — Assessment & Plan Note (Signed)
Stable with good acuity

## 2021-06-12 NOTE — Progress Notes (Signed)
06/12/2021     CHIEF COMPLAINT Patient presents for Retina Evaluation   HISTORY OF PRESENT ILLNESS: [name] is a 75 y.o. female who presents to the clinic today for:     Referring physician: Merri Brunette, MD [address] [place],  [place] [address]  HISTORICAL INFORMATION:   Selected notes from the MEDICAL RECORD NUMBER       CURRENT MEDICATIONS: Current Outpatient Medications (Ophthalmic Drugs)  Medication Sig   cycloSPORINE (RESTASIS) 0.05 % ophthalmic emulsion Place 1 drop into both eyes 2 (two) times daily.   No current facility-administered medications for this visit. (Ophthalmic Drugs)   Current Outpatient Medications (Other)  Medication Sig   amLODipine (NORVASC) 5 MG tablet Take 2.5 mg by mouth daily.   cetirizine (ZYRTEC) 10 MG tablet Take 10 mg by mouth as needed.    Cholecalciferol (VITAMIN D3) 2000 units TABS Take 2,000 Units by mouth daily.   COVID-19 mRNA vaccine, Pfizer, 30 MCG/0.3ML injection INJECT AS DIRECTED   denosumab (PROLIA) 60 MG/ML SOLN injection Inject 60 mg into the skin every 6 (six) months. Administer in upper arm, thigh, or abdomen   metoprolol succinate (TOPROL-XL) 25 MG 24 hr tablet Take 1 tablet (25 mg total) by mouth daily. Take with or immediately following a meal.   metoprolol succinate (TOPROL-XL) 50 MG 24 hr tablet TAKE 1 TABLET BY MOUTH EVERY DAY   metroNIDAZOLE (METROGEL) 0.75 % gel Apply 1 application topically 2 (two) times daily. Pt applies to face.   Multiple Vitamins-Calcium (ONE-A-DAY WOMENS PO) Take 1 tablet daily by mouth.   rOPINIRole (REQUIP) 0.5 MG tablet Take 0.25 mg at bedtime by mouth.   telmisartan (MICARDIS) 80 MG tablet Take 80 mg by mouth daily.   No current facility-administered medications for this visit. (Other)      REVIEW OF SYSTEMS:    ALLERGIES Not on File  PAST MEDICAL HISTORY Past Medical History:  Diagnosis Date   Hypertension    Macular edema    Nuclear sclerotic  cataract of left eye 03/27/2020   Osteoporosis    Past Surgical History:  Procedure Laterality Date   ABDOMINAL ADHESION SURGERY  2009   Dr. Colin Benton.  Malrotation - LOA done   ABDOMINAL HYSTERECTOMY     APPENDECTOMY     LAPAROTOMY N/A 07/13/2017   Procedure: EXPLORATORY LAPAROTOMY, ENTEROLYSIS;  Surgeon: Luretha Murphy, MD;  Location: [facility];  Service: General;  Laterality: N/A;    FAMILY HISTORY Family History  Problem Relation Age of Onset   Stroke Mother    Hypertension Mother    Osteoarthritis Mother     SOCIAL HISTORY Social History   Tobacco Use   Smoking status: Never   Smokeless tobacco: Never  Substance Use Topics   Alcohol use: Yes    Alcohol/week: 1.0 standard drink    Types: 1 Glasses of wine per week    Comment: daily   Drug use: No         OPHTHALMIC EXAM:  Base Eye Exam     Visual Acuity (ETDRS)       Right Left   Dist [place] 20/20 20/30 -2   Dist ph [place]  20/20 -2         Tonometry (Tonopen, 8:33 AM)       Right Left   Pressure 14 10         Neuro/Psych     Oriented x3: Yes   Mood/Affect: Normal  Dilation     Both eyes: 1.0% Mydriacyl, 2.5% Phenylephrine @ 8:33 AM           Slit Lamp and Fundus Exam     External Exam       Right Left   External Normal Normal         Slit Lamp Exam       Right Left   Lids/Lashes Normal Normal   Conjunctiva/Sclera White and quiet White and quiet   Cornea Clear Clear   Anterior Chamber Deep and quiet Deep and quiet   Iris Round and reactive Round and reactive   Lens 2+ Nuclear sclerosis Centered posterior chamber intraocular lens   Anterior Vitreous Normal Normal         Fundus Exam       Right Left   Posterior Vitreous  Normal   Disc  Superior pole of nerve has shunt, collateralization.  No N/V.   C/D Ratio  0.3   Macula  Microaneurysms, with focal retinal thickening and focal exudates temporally.   Vessels  Tortuous,, with macular branch retinal vein occlusion    Periphery  Normal            IMAGING AND PROCEDURES  Imaging and Procedures for 06/12/21  OCT, Retina - OU - Both Eyes       Right Eye Quality was good. Scan locations included subfoveal. Central Foveal Thickness: 300. Progression has been stable.   Left Eye Quality was good. Scan locations included subfoveal. Central Foveal Thickness: 366. Progression has worsened. Findings include abnormal foveal contour, cystoid macular edema.   Notes OS, with less CME from macular BRVO as compared to last visit, post Eylea at 7-week interval today.  We will repeat today     Intravitreal Injection, Pharmacologic Agent - OS - Left Eye       Time Out 06/12/2021. 8:40 AM. Confirmed correct patient, procedure, site, and patient consented.   Anesthesia Topical anesthesia was used.   Procedure Preparation included Tobramycin 0.3%, Ofloxacin . A 30 gauge needle was used.   Injection: 2 mg aflibercept 2 MG/0.05ML   Route: Intravitreal, Site: Left Eye   NDC: L6038910, Lot: [id number], Waste: 0 mL   Post-op Post injection exam found visual acuity of at least counting fingers. The patient tolerated the procedure well. There were no complications. The patient received written and verbal post procedure care education. Post injection medications were not given.              ASSESSMENT/PLAN:  Branch retinal vein occlusion with macular edema of left eye CME perifoveal OS improved at 7-week interval post injection intravitreal Eylea for macular BRVO, recurrent CME.  Repeat injection OS today and maintain 6 to 7-week interval  Cystoid macular edema of left eye Improved,,,     ICD-10-CM   1. Branch retinal vein occlusion with macular edema of left eye  H34.8320 OCT, Retina - OU - Both Eyes    Intravitreal Injection, Pharmacologic Agent - OS - Left Eye    aflibercept (EYLEA) SOLN 2 mg    2. Cystoid macular edema of left eye  H35.352       1.  Today at 7-week status post  intravitreal Eylea for macular branch retinal vein occlusion and secondary CME.  CME is much less with improved acuity OS in the pseudophakic left eye.  Repeat intravitreal injection of Eylea OS today and follow-up next in 7 weeks  2.  Nuclear sclerotic cataract right eye still with good  acuity  3.  Dilate OU next  Ophthalmic Meds Ordered this visit:  Meds ordered this encounter  Medications   aflibercept (EYLEA) SOLN 2 mg       Return in about 7 weeks (around 07/31/2021) for DILATE OU, EYLEA OCT, OS.  There are no Patient Instructions on file for this visit.   Explained the diagnoses, plan, and follow up with the patient and they expressed understanding.  Patient expressed understanding of the importance of proper follow up care.   Alford Highland Zikeria Keough M.D. Diseases & Surgery of the Retina and Vitreous [facility] 06/12/21     Abbreviations: M myopia (nearsighted); A astigmatism; H hyperopia (farsighted); P presbyopia; Mrx spectacle prescription;  CTL contact lenses; OD right eye; OS left eye; OU both eyes  XT exotropia; ET esotropia; PEK punctate epithelial keratitis; PEE punctate epithelial erosions; DES dry eye syndrome; MGD meibomian gland dysfunction; ATs artificial tears; PFAT's preservative free artificial tears; NSC nuclear sclerotic cataract; PSC posterior subcapsular cataract; ERM epi-retinal membrane; PVD posterior vitreous detachment; RD retinal detachment; DM diabetes mellitus; DR diabetic retinopathy; NPDR non-proliferative diabetic retinopathy; PDR proliferative diabetic retinopathy; CSME clinically significant macular edema; DME diabetic macular edema; dbh dot blot hemorrhages; CWS cotton wool spot; POAG primary open angle glaucoma; C/D cup-to-disc ratio; HVF humphrey visual field; GVF goldmann visual field; OCT optical coherence tomography; IOP intraocular pressure; BRVO Branch retinal vein occlusion; CRVO central retinal vein occlusion; CRAO central retinal  artery occlusion; BRAO branch retinal artery occlusion; RT retinal tear; SB scleral buckle; PPV pars plana vitrectomy; VH Vitreous hemorrhage; PRP panretinal laser photocoagulation; IVK intravitreal kenalog; VMT vitreomacular traction; MH Macular hole;  NVD neovascularization of the disc; NVE neovascularization elsewhere; AREDS age related eye disease study; ARMD age related macular degeneration; POAG primary open angle glaucoma; EBMD epithelial/anterior basement membrane dystrophy; ACIOL anterior chamber intraocular lens; IOL intraocular lens; PCIOL posterior chamber intraocular lens; Phaco/IOL phacoemulsification with intraocular lens placement; PRK photorefractive keratectomy; LASIK laser assisted in situ keratomileusis; HTN hypertension; DM diabetes mellitus; COPD chronic obstructive pulmonary disease

## 2021-06-12 NOTE — Assessment & Plan Note (Signed)
Improved,,,

## 2021-06-14 ENCOUNTER — Encounter (INDEPENDENT_AMBULATORY_CARE_PROVIDER_SITE_OTHER): Payer: Medicare HMO | Admitting: Ophthalmology

## 2021-07-31 ENCOUNTER — Other Ambulatory Visit: Payer: Self-pay

## 2021-07-31 ENCOUNTER — Encounter (INDEPENDENT_AMBULATORY_CARE_PROVIDER_SITE_OTHER): Payer: Medicare HMO | Admitting: Ophthalmology

## 2021-07-31 ENCOUNTER — Encounter: Payer: Self-pay | Admitting: Cardiovascular Disease

## 2021-07-31 ENCOUNTER — Ambulatory Visit: Payer: Medicare HMO | Admitting: Cardiovascular Disease

## 2021-07-31 VITALS — BP 170/90 | HR 63 | Ht 66.0 in | Wt 112.6 lb

## 2021-07-31 DIAGNOSIS — I10 Essential (primary) hypertension: Secondary | ICD-10-CM | POA: Diagnosis not present

## 2021-07-31 DIAGNOSIS — R002 Palpitations: Secondary | ICD-10-CM | POA: Diagnosis not present

## 2021-07-31 DIAGNOSIS — I341 Nonrheumatic mitral (valve) prolapse: Secondary | ICD-10-CM

## 2021-07-31 DIAGNOSIS — G2581 Restless legs syndrome: Secondary | ICD-10-CM

## 2021-07-31 DIAGNOSIS — I471 Supraventricular tachycardia: Secondary | ICD-10-CM

## 2021-07-31 DIAGNOSIS — I8393 Asymptomatic varicose veins of bilateral lower extremities: Secondary | ICD-10-CM

## 2021-07-31 MED ORDER — AMLODIPINE BESYLATE 5 MG PO TABS: 5.0000 mg | ORAL_TABLET | Freq: Every evening | ORAL | 3 refills | Status: DC

## 2021-07-31 NOTE — Patient Instructions (Addendum)
Medication Instructions:  INCREASE amlodipine to 5mg  (1 tablet) daily in the evening.   *If you need a refill on your cardiac medications before your next appointment, please call your pharmacy*   Lab Work: None ordered.     Testing/Procedures: None ordered.    Follow-Up: At [facility], you and your health needs are our priority.  As part of our continuing mission to provide you with exceptional heart care, we have created designated Provider Care Teams.  These Care Teams include your primary Cardiologist (physician) and Advanced Practice Providers (APPs -  Physician Assistants and Nurse Practitioners) who all work together to provide you with the care you need, when you need it.  We recommend signing up for the patient portal called "MyChart".  Sign up information is provided on this After Visit Summary.  MyChart is used to connect with patients for Virtual Visits (Telemedicine).  Patients are able to view lab/test results, encounter notes, upcoming appointments, etc.  Non-urgent messages can be sent to your provider as well.   To learn more about what you can do with MyChart, go to [facility].    Your next appointment:   12 month(s)  The format for your next appointment:   In Person  Provider:   [email or web address], MD

## 2021-07-31 NOTE — Progress Notes (Signed)
Cardiology Office Note    Date:  07/31/2021   ID:  [name], DOB 11/23/1946, MRN [id number]  PCP:  Deland Pretty, MD  Cardiologist:  Shelva Majestic, MD   F/U cardiology evaluation initially referred by Dr. Junius Roads.  History of Present Illness:  [name] is a 74 y.o. female who is the wife of Dr. Marveen Reeks, a retired rheumatologist.  I saw her for initial cardiology evaluation November 08, 2019.  She presents for a 15  month follow-up evaluation.  [name] is followed by Dr. Shelia Media and was recently evaluated with symptoms of increasing palpitations.  She has a history of Raynaud's syndrome, hypertension on Micardis, restless leg syndrome, and osteoporosis.  Because of episodes of palpitations, she had worn a Zio patch from September 30 through September 29, 2019 and was monitored for 6 days and 21 hours.  The predominant rhythm was sinus rhythm with an average rate at 77 bpm.  Her minimum heart rate was sinus bradycardia at 49 bpm and maximum heart rate was 162 bpm.  She was found to have 2 episodes of supraventricular tachycardia with the fastest interval lasting 11 beats with a maximum rate at 162 bpm.  There were rare isolated PACs and couplets.  Isolated PVCs were occasional at 4.1% rare ventricular couplet without triplets.  There also were episodes of ventricular bigeminy and trigeminy.   She has a history hypertension for at least 15 years.  In addition she has a history of retinal branch vein occlusion of her left eye.  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  She denies any chest pain.  She denies any presyncope or syncope.   When I initially saw her on November 08, 2019, her physical examination was highly suggestive of mitral valve prolapse with multiple systolic clicks and MR murmur at the apex.  I reviewed her Zio patch monitor.  I scheduled her for an echo Doppler study which confirmed my suspicion and revealed normal LV function.  Her  mitral valve was mildly myxomatous with mitral valve prolapse and mild mitral regurgitation.  She had mildly increased RV systolic pressure at 24.4 mmHg.  I recommended initiation of Toprol-XL 25 mg which would be helpful both for her labile blood pressure as well as ectopy suppression.  Since initiating Toprol she has felt significantly improved.  She still notes occasional episodes of intermittent heart rate irregularity.  She denies any presyncope or syncope.  She denies any chest pain.  Laboratory revealed TSH 3.2.  Chemistry was normal with exception of minimal glucose increased at 107.    I saw her in January 2021.  At that time, her palpitations had improved with her Toprol regimen.  Pressure continues to be mildly elevated.  I recommended titration of Toprol-XL to 37.5 mg for 1 week and then to increase this to 50 mg daily.  She continued be on amlodipine 2.5 mg and telmisartan 80 mg.  I last saw her in May 2021.  Over the prior 4 months she felt significantly improved on her Toprol regimen.  She denied  any awareness of any palpitations.  Her blood pressure has now been well controlled.  She denied shortness of breath.  She deniedpalpitations.  She denied abdominal pain.  She sees Dr. Deland Pretty for primary care who checks laboratory.  I recommended that she continue her multidrug regimen consisting of amlodipine 2.5 mg and Toprol-XL 50 mg in addition to telmisartan 80 mg daily.  Since I  last saw her, she denies any chest pain or shortness of breath.  She has issues with bilateral lower extremity varicose veins.  She is unaware of palpitations.  She believes her blood pressure at home has been fairly stable.  She presents for evaluation.  Past Medical History:  Diagnosis Date   Hypertension    Macular edema    Nuclear sclerotic cataract of left eye 03/27/2020   Osteoporosis     Past Surgical History:  Procedure Laterality Date   ABDOMINAL ADHESION SURGERY  2009   Dr. Zettie Pho.  Malrotation -  LOA done   ABDOMINAL HYSTERECTOMY     APPENDECTOMY     LAPAROTOMY N/A 07/13/2017   Procedure: EXPLORATORY LAPAROTOMY, ENTEROLYSIS;  Surgeon: Johnathan Hausen, MD;  Location: [facility];  Service: General;  Laterality: N/A;    Current Medications: Outpatient Medications Prior to Visit  Medication Sig Dispense Refill   cetirizine (ZYRTEC) 10 MG tablet Take 10 mg by mouth as needed.      Cholecalciferol (VITAMIN D3) 2000 units TABS Take 2,000 Units by mouth daily.     COVID-19 mRNA vaccine, Pfizer, 30 MCG/0.3ML injection INJECT AS DIRECTED .3 mL 0   cycloSPORINE (RESTASIS) 0.05 % ophthalmic emulsion Place 1 drop into both eyes 2 (two) times daily.     denosumab (PROLIA) 60 MG/ML SOLN injection Inject 60 mg into the skin every 6 (six) months. Administer in upper arm, thigh, or abdomen     metoprolol succinate (TOPROL-XL) 50 MG 24 hr tablet TAKE 1 TABLET BY MOUTH EVERY DAY 90 tablet 0   metroNIDAZOLE (METROGEL) 0.75 % gel Apply 1 application topically 2 (two) times daily. Pt applies to face.     Multiple Vitamins-Calcium (ONE-A-DAY WOMENS PO) Take 1 tablet daily by mouth.     rOPINIRole (REQUIP) 0.5 MG tablet Take 0.25 mg at bedtime by mouth.  4   telmisartan (MICARDIS) 80 MG tablet Take 80 mg by mouth daily.     amLODipine (NORVASC) 5 MG tablet Take 2.5 mg by mouth daily.     metoprolol succinate (TOPROL-XL) 25 MG 24 hr tablet Take 1 tablet (25 mg total) by mouth daily. Take with or immediately following a meal. 30 tablet 3   No facility-administered medications prior to visit.     Allergies:   Patient has no allergy information on record.   Social History   Socioeconomic History   Marital status: Married    Spouse name: Not on file   Number of children: Not on file   Years of education: Not on file   Highest education level: Not on file  Occupational History   Not on file  Tobacco Use   Smoking status: Never   Smokeless tobacco: Never  Substance and Sexual Activity   Alcohol use: Yes     Alcohol/week: 1.0 standard drink    Types: 1 Glasses of wine per week    Comment: daily   Drug use: No   Sexual activity: Never  Other Topics Concern   Not on file  Social History Narrative   Not on file   Social Determinants of Health   Financial Resource Strain: Not on file  Food Insecurity: Not on file  Transportation Needs: Not on file  Physical Activity: Not on file  Stress: Not on file  Social Connections: Not on file    Socially she is married to Dr. Marveen Reeks for 50 years.  She has 4 children and 2 grandchildren.  She has a Scientist, water quality.  She  drinks occasional red wine.  She walks for approximately half hour 4 days/week.  There is no tobacco history.  Family History:  The patient's family history includes Hypertension in her mother; Osteoarthritis in her mother; Stroke in her mother.   Parents are deceased.  Her mother died at age 45 and had a history of atrial fibrillation, stroke and congestive heart failure.  Her father died at age 66 and had a subarachnoid hemorrhage and died suddenly.  ROS General: Negative; No fevers, chills, or night sweats;  HEENT: History of retinal branch vein occlusion of her left eye; No changes in vision or hearing, sinus congestion, difficulty swallowing Pulmonary: Negative; No cough, wheezing, shortness of breath, hemoptysis Cardiovascular: Negative; No chest pain, presyncope, syncope, palpitations GI: Congenital malrotation of her intestines GU: Negative; No dysuria, hematuria, or difficulty voiding Musculoskeletal: Negative; no myalgias, joint pain, or weakness Hematologic/Oncology: Negative; no easy bruising, bleeding Endocrine: Negative; no heat/cold intolerance; no diabetes Neuro: History of rosacea Psychiatric: Negative; No behavioral problems, depression Sleep: History of restless legs on requip;  No snoring, daytime sleepiness, hypersomnolence, bruxism, restless legs, hypnogognic hallucinations, no cataplexy Other  comprehensive 14 point system review is negative.   PHYSICAL EXAM:   VS:  BP (!) 170/90 (BP Location: Right Arm)   Pulse 63   Ht 5' 6"  (1.676 m)   Wt 112 lb 9.6 oz (51.1 kg)   SpO2 97%   BMI 18.17 kg/m     Repeat blood pressure by me was elevated at 170/90  Wt Readings from Last 3 Encounters:  07/31/21 112 lb 9.6 oz (51.1 kg)  05/16/20 112 lb 12.8 oz (51.2 kg)  01/04/20 113 lb (51.3 kg)    General: Alert, oriented, no distress.  Skin: normal turgor, no rashes, warm and dry HEENT: Normocephalic, atraumatic. Pupils equal round and reactive to light; sclera anicteric; extraocular muscles intact;  Nose without nasal septal hypertrophy Mouth/Parynx benign; Mallinpatti scale 2 Neck: No JVD, no carotid bruits; normal carotid upstroke Lungs: clear to ausculatation and percussion; no wheezing or rales Chest wall: without tenderness to palpitation Heart: PMI not displaced, RRR, s1 s2 normal, 3-8/8 systolic murmur at apex consistent with mitral regurgitation, systolic click, no diastolic murmur, no rubs, gallops, thrills, or heaves Abdomen: soft, nontender; no hepatosplenomehaly, BS+; abdominal aorta nontender and not dilated by palpation. Back: no CVA tenderness Pulses 2+ Musculoskeletal: full range of motion, normal strength, no joint deformities Extremities: no clubbing cyanosis or edema, Homan's sign negative  Neurologic: grossly nonfocal; Cranial nerves grossly wnl Psychologic: Normal mood and affect    Studies/Labs Reviewed:   EKG:  EKG is ordered today.  ECG (independently read by me):  NSR at 63; mild RV conduction delay  May 2021 ECG (independently read by me): Sinus bradycardia at 56 bpm.  No ectopy.  Normal intervals.  January 2021 ECG (independently read by me): Normal sinus rhythm at 70; mild RV conduction delay     November 2020 ECG (independently read by me): Sinus tachycardia at 104 with frequent PVCs and PAC.  Recent Labs: BMP Latest Ref Rng & Units 11/08/2019  10/31/2017 10/30/2017  Glucose 65 - 99 mg/dL 107(H) 89 111(H)  BUN 8 - 27 mg/dL 15 9 12   Creatinine 0.57 - 1.00 mg/dL 0.61 0.41(L) 0.50  BUN/Creat Ratio 12 - 28 25 - -  Sodium 134 - 144 mmol/L 136 141 143  Potassium 3.5 - 5.2 mmol/L 4.5 3.5 3.7  Chloride 96 - 106 mmol/L 94(L) 109 110  CO2 20 - 29  mmol/L 24 26 28   Calcium 8.7 - 10.3 mg/dL 9.6 8.0(L) 8.2(L)     Hepatic Function Latest Ref Rng & Units 10/28/2017 07/10/2017 10/18/2008  Total Protein 6.5 - 8.1 g/dL 8.7(H) 8.5(H) 8.2  Albumin 3.5 - 5.0 g/dL 5.1(H) 4.9 4.7  AST 15 - 41 U/L 41 39 41(H)  ALT 14 - 54 U/L 44 24 37(H)  Alk Phosphatase 38 - 126 U/L 103 56 79  Total Bilirubin 0.3 - 1.2 mg/dL 1.0 3.0(H) 1.6(H)    CBC Latest Ref Rng & Units 10/29/2017 10/28/2017 07/17/2017  WBC 4.0 - 10.5 K/uL 9.4 13.1(H) 6.6  Hemoglobin 12.0 - 15.0 g/dL 13.0 14.1 11.5(L)  Hematocrit 36.0 - 46.0 % 38.3 41.1 32.3(L)  Platelets 150 - 400 K/uL 256 290 278   Lab Results  Component Value Date   MCV 93.2 10/29/2017   MCV 92.6 10/28/2017   MCV 89.7 07/17/2017   Lab Results  Component Value Date   TSH 3.240 11/08/2019   No results found for: HGBA1C   BNP No results found for: BNP  ProBNP No results found for: PROBNP   Lipid Panel  No results found for: CHOL, TRIG, HDL, CHOLHDL, VLDL, LDLCALC, LDLDIRECT, LABVLDL   RADIOLOGY: No results found.   Additional studies/ records that were reviewed today include:  Reviewed the records of Dr. Deland Pretty.  I reviewed the Zio patch monitor.  Laboratory from Dr. Junius Roads of September 17, 2019: Hemoglobin 13.3, hematocrit 39.3.  Platelets 220 2K Glucose 94, BUN 11 creatinine 0.6.  LFTs normal.  GFR 103 Cholesterol 229, triglycerides 66, HDL 92, LDL 12  ECHO 11/17/2019 1. Left ventricular ejection fraction, by visual estimation, is 60 to 65%. The left ventricle has normal function. There is no left ventricular hypertrophy. 2. The left ventricle has no regional wall motion abnormalities. 3.  Global right ventricle has normal systolic function.The right ventricular size is normal. No increase in right ventricular wall thickness. 4. Left atrial size was normal. 5. Right atrial size was normal. 6. Mild mitral valve prolapse. 7. The mitral valve is myxomatous. Mild mitral valve regurgitation. No evidence of mitral stenosis. 8. The tricuspid valve is normal in structure. Tricuspid valve regurgitation mild-moderate. 9. The aortic valve is normal in structure. Aortic valve regurgitation is not visualized. No evidence of aortic valve sclerosis or stenosis. 10. The pulmonic valve was normal in structure. Pulmonic valve regurgitation is not visualized. 11. Mildly elevated pulmonary artery systolic pressure. 12. The tricuspid regurgitant velocity is 2.68 m/s, and with an assumed right atrial pressure of 3 mmHg, the estimated right ventricular systolic pressure is mildly elevated at 31.7 mmHg. 13. The inferior vena cava is normal in size with greater  ASSESSMENT:    1. Essential hypertension   2. Mitral valve prolapse   3. Palpitations   4. PSVT (paroxysmal supraventricular tachycardia) (HCC)   5. Varicose veins of both lower extremities, unspecified whether complicated   6. Restless legs syndrome (RLS)     PLAN:  Allison. Brentney Goldbach is a 75 year old female who was referred by Dr. Junius Roads for palpitations.  She has a longstanding history of hypertension  and when I initially saw her was on a regimen of amlodipine 2.5 mg in addition to telmisartan 80 mg daily.  She was significantly hypertensive and on cardiac monitoring she was found to have frequent PVCs as well as 2 episodes of supraventricular tachycardia with the fastest interval lasting 11 beats with a maximum rate at 162 bpm.  She also  had several ventricular couplets as well as isolated PACs.  When I initially saw her she had stage II hypertension.  Her palpitations have significantly improved with initiation of metoprolol  succinate.  Evaluation made 2021 on increased Toprol regimen of 50 mg daily, her palpitations had completely resolved.  Presently, her blood pressure is elevated and I suspect she has a significant labile component.  She states her blood pressure at home is typically stable.  However with her blood pressure 170/90 I have recommended titration of her amlodipine from 2.5 mg up to 5 mg daily.  She will continue with her telmisartan 80 mg in addition to metoprolol succinate 50 mg daily.  She is not having any chest pain or palpitations.  She is not having any presyncope or syncope.  She has a longstanding history of lower extremity varicose veins bilaterally.  She is on Reglan for restless legs.  She will be seeing Dr. Deland Pretty next month and laboratory will be obtained.  She will continue to monitor her blood pressure on her increased regimen.  I will see her in 1 year for reevaluation or sooner as needed.  Medication Adjustments/Labs and Tests Ordered: Current medicines are reviewed at length with the patient today.  Concerns regarding medicines are outlined above.  Medication changes, Labs and Tests ordered today are listed in the Patient Instructions below. Patient Instructions  Medication Instructions:  INCREASE amlodipine to 62m (1 tablet) daily in the evening.   *If you need a refill on your cardiac medications before your next appointment, please call your pharmacy*   Lab Work: None ordered.     Testing/Procedures: None ordered.    Follow-Up: [facility] you and your health needs are our priority.  As part of our continuing mission to provide you with exceptional heart care, we have created designated Provider Care Teams.  These Care Teams include your primary Cardiologist (physician) and Advanced Practice Providers (APPs -  Physician Assistants and Nurse Practitioners) who all work together to provide you with the care you need, when you need it.  We recommend signing up for  the patient portal called "MyChart".  Sign up information is provided on this After Visit Summary.  MyChart is used to connect with patients for Virtual Visits (Telemedicine).  Patients are able to view lab/test results, encounter notes, upcoming appointments, etc.  Non-urgent messages can be sent to your provider as well.   To learn more about what you can do with MyChart, go to [email or web address]    Your next appointment:   12 month(s)  The format for your next appointment:   In Person  Provider:   TShelva Majestic MD     Signed, TShelva Majestic MD  07/31/2021 2:11 PM    [id number] [place]  [id number]: [phone]

## 2021-08-06 ENCOUNTER — Ambulatory Visit (INDEPENDENT_AMBULATORY_CARE_PROVIDER_SITE_OTHER): Payer: Medicare HMO | Admitting: Ophthalmology

## 2021-08-06 ENCOUNTER — Encounter (INDEPENDENT_AMBULATORY_CARE_PROVIDER_SITE_OTHER): Payer: Self-pay | Admitting: Ophthalmology

## 2021-08-06 ENCOUNTER — Other Ambulatory Visit: Payer: Self-pay

## 2021-08-06 DIAGNOSIS — H34832 Tributary (branch) retinal vein occlusion, left eye, with macular edema: Secondary | ICD-10-CM

## 2021-08-06 DIAGNOSIS — H2511 Age-related nuclear cataract, right eye: Secondary | ICD-10-CM | POA: Diagnosis not present

## 2021-08-06 MED ORDER — AFLIBERCEPT 2MG/0.05ML IZ SOLN FOR KALEIDOSCOPE
2.0000 mg | INTRAVITREAL | Status: AC | PRN
  Administered 2021-08-06: 2 mg via INTRAVITREAL

## 2021-08-06 NOTE — Progress Notes (Signed)
08/06/2021     CHIEF COMPLAINT Patient presents for Retina Follow Up   HISTORY OF PRESENT ILLNESS: [name] is a 75 y.o. female who presents to the clinic today for:   HPI     Retina Follow Up           Diagnosis: CRVO/BRVO   Laterality: left eye   Onset: 7 weeks ago   Severity: mild   Duration: 7 weeks   Course: stable         Comments   7 week fu OU and Eylea OS Pt states VA OU stable since last visit. Pt denies FOL, floaters, or ocular pain OU.  Pt uses Restasis BID OU       Last edited by Demetrios Loll, COA on 08/06/2021  8:35 AM.      Referring physician: Merri Brunette, MD [address] [place],  [place] [address]  HISTORICAL INFORMATION:   Selected notes from the MEDICAL RECORD NUMBER       CURRENT MEDICATIONS: Current Outpatient Medications (Ophthalmic Drugs)  Medication Sig   cycloSPORINE (RESTASIS) 0.05 % ophthalmic emulsion Place 1 drop into both eyes 2 (two) times daily.   No current facility-administered medications for this visit. (Ophthalmic Drugs)   Current Outpatient Medications (Other)  Medication Sig   amLODipine (NORVASC) 5 MG tablet Take 1 tablet (5 mg total) by mouth every evening.   cetirizine (ZYRTEC) 10 MG tablet Take 10 mg by mouth as needed.    Cholecalciferol (VITAMIN D3) 2000 units TABS Take 2,000 Units by mouth daily.   COVID-19 mRNA vaccine, Pfizer, 30 MCG/0.3ML injection INJECT AS DIRECTED   denosumab (PROLIA) 60 MG/ML SOLN injection Inject 60 mg into the skin every 6 (six) months. Administer in upper arm, thigh, or abdomen   metoprolol succinate (TOPROL-XL) 50 MG 24 hr tablet TAKE 1 TABLET BY MOUTH EVERY DAY   metroNIDAZOLE (METROGEL) 0.75 % gel Apply 1 application topically 2 (two) times daily. Pt applies to face.   Multiple Vitamins-Calcium (ONE-A-DAY WOMENS PO) Take 1 tablet daily by mouth.   rOPINIRole (REQUIP) 0.5 MG tablet Take 0.25 mg at bedtime by mouth.   telmisartan (MICARDIS) 80 MG tablet  Take 80 mg by mouth daily.   No current facility-administered medications for this visit. (Other)      REVIEW OF SYSTEMS:    ALLERGIES Not on File  PAST MEDICAL HISTORY Past Medical History:  Diagnosis Date   Hypertension    Macular edema    Nuclear sclerotic cataract of left eye 03/27/2020   Osteoporosis    Past Surgical History:  Procedure Laterality Date   ABDOMINAL ADHESION SURGERY  2009   Dr. Colin Benton.  Malrotation - LOA done   ABDOMINAL HYSTERECTOMY     APPENDECTOMY     LAPAROTOMY N/A 07/13/2017   Procedure: EXPLORATORY LAPAROTOMY, ENTEROLYSIS;  Surgeon: Luretha Murphy, MD;  Location: [facility];  Service: General;  Laterality: N/A;    FAMILY HISTORY Family History  Problem Relation Age of Onset   Stroke Mother    Hypertension Mother    Osteoarthritis Mother     SOCIAL HISTORY Social History   Tobacco Use   Smoking status: Never   Smokeless tobacco: Never  Substance Use Topics   Alcohol use: Yes    Alcohol/week: 1.0 standard drink    Types: 1 Glasses of wine per week    Comment: daily   Drug use: No         OPHTHALMIC EXAM:  Base  Eye Exam     Visual Acuity (ETDRS)       Right Left   Dist [place] 20/25 +1 20/40   Dist ph [place]  20/30 -1         Tonometry (Tonopen, 8:37 AM)       Right Left   Pressure 13 13         Pupils       Pupils Dark Light Shape React APD   Right PERRL 3 2 Round Brisk None   Left PERRL 3 2 Round Brisk None         Visual Fields (Counting fingers)       Left Right    Full Full         Extraocular Movement       Right Left    Full Full         Neuro/Psych     Oriented x3: Yes   Mood/Affect: Normal         Dilation     Both eyes: 1.0% Mydriacyl, 2.5% Phenylephrine @ 8:37 AM           Slit Lamp and Fundus Exam     External Exam       Right Left   External Normal Normal         Slit Lamp Exam       Right Left   Lids/Lashes Normal Normal   Conjunctiva/Sclera White and quiet White  and quiet   Cornea Clear Clear   Anterior Chamber Deep and quiet Deep and quiet   Iris Round and reactive Round and reactive   Lens 2+ Nuclear sclerosis Centered posterior chamber intraocular lens   Anterior Vitreous Normal Normal         Fundus Exam       Right Left   Posterior Vitreous Normal Normal   Disc Normal Superior pole of nerve has shunt, collateralization.  No N/V.   C/D Ratio 0.2 0.3   Macula Normal Microaneurysms, with focal retinal thickening and focal exudates temporally.   Vessels Normal Tortuous,, with macular branch retinal vein occlusion   Periphery Normal Normal            IMAGING AND PROCEDURES  Imaging and Procedures for 08/06/21  OCT, Retina - OU - Both Eyes       Right Eye Quality was good. Scan locations included subfoveal. Central Foveal Thickness: 297. Progression has been stable. Findings include normal foveal contour.   Left Eye Quality was good. Scan locations included subfoveal. Central Foveal Thickness: 349. Progression has worsened. Findings include abnormal foveal contour, cystoid macular edema.   Notes OS, with less CME from macular BRVO as compared to last visit, post Eylea at 8-week interval today.  Vastly improved as compared to May, with much less center involved CME, we will repeat her vitreal Eylea today       Intravitreal Injection, Pharmacologic Agent - OS - Left Eye       Time Out 08/06/2021. 9:03 AM. Confirmed correct patient, procedure, site, and patient consented.   Anesthesia Topical anesthesia was used. Anesthetic medications included Akten 3.5%.   Procedure Preparation included Tobramycin 0.3%, Ofloxacin , 10% betadine to eyelids, 5% betadine to ocular surface. A 30 gauge needle was used.   Injection: 2 mg aflibercept 2 MG/0.05ML   Route: Intravitreal, Site: Left Eye   NDC: L6038910, Lot: [id number], Waste: 0 mL   Post-op Post injection exam found visual acuity of at least counting fingers. The  patient  tolerated the procedure well. There were no complications. The patient received written and verbal post procedure care education. Post injection medications were not given.              ASSESSMENT/PLAN:  Branch retinal vein occlusion with macular edema of left eye Much improved CME secondary to macular BRVO as compared to recurrence noted May 2022.  Currently at 7 weeks 6 days (8 weeks) postinjection, with improving retinal thickening, will repeat injection today and extend interval 9 weeks to an attempt to decrease treatment burden    Nuclear sclerotic cataract of right eye Progressive cataract present OD      ICD-10-CM   1. Branch retinal vein occlusion with macular edema of left eye  H34.8320 OCT, Retina - OU - Both Eyes    Intravitreal Injection, Pharmacologic Agent - OS - Left Eye    aflibercept (EYLEA) SOLN 2 mg    2. Nuclear sclerotic cataract of right eye  H25.11       1.  Repeat injection intravitreal Eylea OS today, follow-up next OS in 9 weeks  2.  OD cataract as per symptoms, currently not troubling the patient  3.  Ophthalmic Meds Ordered this visit:  Meds ordered this encounter  Medications   aflibercept (EYLEA) SOLN 2 mg       Return in about 9 weeks (around 10/08/2021) for dilate, OS, EYLEA OCT.  There are no Patient Instructions on file for this visit.   Explained the diagnoses, plan, and follow up with the patient and they expressed understanding.  Patient expressed understanding of the importance of proper follow up care.   Alford Highland Trissa Molina M.D. Diseases & Surgery of the Retina and Vitreous [facility] 08/06/21     Abbreviations: M myopia (nearsighted); A astigmatism; H hyperopia (farsighted); P presbyopia; Mrx spectacle prescription;  CTL contact lenses; OD right eye; OS left eye; OU both eyes  XT exotropia; ET esotropia; PEK punctate epithelial keratitis; PEE punctate epithelial erosions; DES dry eye syndrome; MGD  meibomian gland dysfunction; ATs artificial tears; PFAT's preservative free artificial tears; NSC nuclear sclerotic cataract; PSC posterior subcapsular cataract; ERM epi-retinal membrane; PVD posterior vitreous detachment; RD retinal detachment; DM diabetes mellitus; DR diabetic retinopathy; NPDR non-proliferative diabetic retinopathy; PDR proliferative diabetic retinopathy; CSME clinically significant macular edema; DME diabetic macular edema; dbh dot blot hemorrhages; CWS cotton wool spot; POAG primary open angle glaucoma; C/D cup-to-disc ratio; HVF humphrey visual field; GVF goldmann visual field; OCT optical coherence tomography; IOP intraocular pressure; BRVO Branch retinal vein occlusion; CRVO central retinal vein occlusion; CRAO central retinal artery occlusion; BRAO branch retinal artery occlusion; RT retinal tear; SB scleral buckle; PPV pars plana vitrectomy; VH Vitreous hemorrhage; PRP panretinal laser photocoagulation; IVK intravitreal kenalog; VMT vitreomacular traction; MH Macular hole;  NVD neovascularization of the disc; NVE neovascularization elsewhere; AREDS age related eye disease study; ARMD age related macular degeneration; POAG primary open angle glaucoma; EBMD epithelial/anterior basement membrane dystrophy; ACIOL anterior chamber intraocular lens; IOL intraocular lens; PCIOL posterior chamber intraocular lens; Phaco/IOL phacoemulsification with intraocular lens placement; PRK photorefractive keratectomy; LASIK laser assisted in situ keratomileusis; HTN hypertension; DM diabetes mellitus; COPD chronic obstructive pulmonary disease

## 2021-08-06 NOTE — Assessment & Plan Note (Signed)
Progressive cataract present OD

## 2021-08-06 NOTE — Assessment & Plan Note (Signed)
Much improved CME secondary to macular BRVO as compared to recurrence noted May 2022.  Currently at 7 weeks 6 days (8 weeks) postinjection, with improving retinal thickening, will repeat injection today and extend interval 9 weeks to an attempt to decrease treatment burden

## 2021-08-25 ENCOUNTER — Other Ambulatory Visit: Payer: Self-pay | Admitting: Cardiovascular Disease

## 2021-09-27 DIAGNOSIS — I10 Essential (primary) hypertension: Secondary | ICD-10-CM | POA: Diagnosis not present

## 2021-09-27 DIAGNOSIS — E7801 Familial hypercholesterolemia: Secondary | ICD-10-CM | POA: Diagnosis not present

## 2021-10-02 DIAGNOSIS — I10 Essential (primary) hypertension: Secondary | ICD-10-CM | POA: Diagnosis not present

## 2021-10-02 DIAGNOSIS — M8580 Other specified disorders of bone density and structure, unspecified site: Secondary | ICD-10-CM | POA: Diagnosis not present

## 2021-10-02 DIAGNOSIS — E7800 Pure hypercholesterolemia, unspecified: Secondary | ICD-10-CM | POA: Diagnosis not present

## 2021-10-02 DIAGNOSIS — Z0000 Encounter for general adult medical examination without abnormal findings: Secondary | ICD-10-CM | POA: Diagnosis not present

## 2021-10-02 DIAGNOSIS — Z23 Encounter for immunization: Secondary | ICD-10-CM | POA: Diagnosis not present

## 2021-10-02 DIAGNOSIS — G2581 Restless legs syndrome: Secondary | ICD-10-CM | POA: Diagnosis not present

## 2021-10-02 DIAGNOSIS — R600 Localized edema: Secondary | ICD-10-CM | POA: Diagnosis not present

## 2021-10-05 DIAGNOSIS — M810 Age-related osteoporosis without current pathological fracture: Secondary | ICD-10-CM | POA: Diagnosis not present

## 2021-10-08 ENCOUNTER — Encounter (INDEPENDENT_AMBULATORY_CARE_PROVIDER_SITE_OTHER): Payer: Self-pay | Admitting: Ophthalmology

## 2021-10-08 ENCOUNTER — Other Ambulatory Visit: Payer: Self-pay

## 2021-10-08 ENCOUNTER — Ambulatory Visit (INDEPENDENT_AMBULATORY_CARE_PROVIDER_SITE_OTHER): Payer: Medicare HMO | Admitting: Ophthalmology

## 2021-10-08 DIAGNOSIS — H34832 Tributary (branch) retinal vein occlusion, left eye, with macular edema: Secondary | ICD-10-CM

## 2021-10-08 MED ORDER — AFLIBERCEPT 2MG/0.05ML IZ SOLN FOR KALEIDOSCOPE
2.0000 mg | INTRAVITREAL | Status: AC | PRN
  Administered 2021-10-08: 2 mg via INTRAVITREAL

## 2021-10-08 NOTE — Progress Notes (Signed)
10/08/2021     CHIEF COMPLAINT Patient presents for  Chief Complaint  Patient presents with   Retina Follow Up      HISTORY OF PRESENT ILLNESS: [name] is a 75 y.o. female who presents to the clinic today for:   HPI     Retina Follow Up   Patient presents with  CRVO/BRVO.  In left eye.  This started 9 weeks ago.  Severity is mild.  Duration of 9 weeks.  Since onset it is stable.        Comments   9 week fu OS oct eylea OS. Patient states vision is stable and unchanged since last visit. Denies any new floaters or FOL. Pt uses restasis bid ou for dry eyes.      Last edited by Nelva Nay on 10/08/2021  1:35 PM.      Referring physician: Merri Brunette, MD [address] [place],  [place] [address]  HISTORICAL INFORMATION:   Selected notes from the MEDICAL RECORD NUMBER       CURRENT MEDICATIONS: Current Outpatient Medications (Ophthalmic Drugs)  Medication Sig   cycloSPORINE (RESTASIS) 0.05 % ophthalmic emulsion Place 1 drop into both eyes 2 (two) times daily.   No current facility-administered medications for this visit. (Ophthalmic Drugs)   Current Outpatient Medications (Other)  Medication Sig   amLODipine (NORVASC) 5 MG tablet Take 1 tablet (5 mg total) by mouth every evening.   cetirizine (ZYRTEC) 10 MG tablet Take 10 mg by mouth as needed.    Cholecalciferol (VITAMIN D3) 2000 units TABS Take 2,000 Units by mouth daily.   COVID-19 mRNA vaccine, Pfizer, 30 MCG/0.3ML injection INJECT AS DIRECTED   denosumab (PROLIA) 60 MG/ML SOLN injection Inject 60 mg into the skin every 6 (six) months. Administer in upper arm, thigh, or abdomen   metoprolol succinate (TOPROL-XL) 50 MG 24 hr tablet TAKE 1 TABLET BY MOUTH EVERY DAY   metroNIDAZOLE (METROGEL) 0.75 % gel Apply 1 application topically 2 (two) times daily. Pt applies to face.   Multiple Vitamins-Calcium (ONE-A-DAY WOMENS PO) Take 1 tablet daily by mouth.   rOPINIRole (REQUIP) 0.5 MG  tablet Take 0.25 mg at bedtime by mouth.   telmisartan (MICARDIS) 80 MG tablet Take 80 mg by mouth daily.   No current facility-administered medications for this visit. (Other)      REVIEW OF SYSTEMS: ROS   Negative for: Constitutional, Gastrointestinal, Neurological, Skin, Genitourinary, Musculoskeletal, HENT, Endocrine, Cardiovascular, Eyes, Respiratory, Psychiatric, Allergic/Imm, Heme/Lymph Last edited by Edmon Crape, MD on 10/08/2021  1:56 PM.       ALLERGIES Not on File  PAST MEDICAL HISTORY Past Medical History:  Diagnosis Date   Hypertension    Macular edema    Nuclear sclerotic cataract of left eye 03/27/2020   Osteoporosis    Past Surgical History:  Procedure Laterality Date   ABDOMINAL ADHESION SURGERY  2009   Dr. Colin Benton.  Malrotation - LOA done   ABDOMINAL HYSTERECTOMY     APPENDECTOMY     LAPAROTOMY N/A 07/13/2017   Procedure: EXPLORATORY LAPAROTOMY, ENTEROLYSIS;  Surgeon: Luretha Murphy, MD;  Location: [facility];  Service: General;  Laterality: N/A;    FAMILY HISTORY Family History  Problem Relation Age of Onset   Stroke Mother    Hypertension Mother    Osteoarthritis Mother     SOCIAL HISTORY Social History   Tobacco Use   Smoking status: Never   Smokeless tobacco: Never  Substance Use Topics   Alcohol  use: Yes    Alcohol/week: 1.0 standard drink    Types: 1 Glasses of wine per week    Comment: daily   Drug use: No         OPHTHALMIC EXAM:  Base Eye Exam     Visual Acuity (ETDRS)       Right Left   Dist [place] 20/20 -1 20/30 -2         Tonometry (Tonopen, 1:38 PM)       Right Left   Pressure 17 13         Pupils       Pupils Dark Light Shape React APD   Right PERRL 3 2 Round Brisk None   Left PERRL 3 2 Round Brisk None         Extraocular Movement       Right Left    Full Full         Neuro/Psych     Oriented x3: Yes   Mood/Affect: Normal         Dilation     Both eyes: 1.0% Mydriacyl, 2.5%  Phenylephrine @ 1:38 PM           Slit Lamp and Fundus Exam     External Exam       Right Left   External Normal Normal         Slit Lamp Exam       Right Left   Lids/Lashes Normal Normal   Conjunctiva/Sclera White and quiet White and quiet   Cornea Clear Clear   Anterior Chamber Deep and quiet Deep and quiet   Iris Round and reactive Round and reactive   Lens 2+ Nuclear sclerosis Centered posterior chamber intraocular lens   Anterior Vitreous Normal Normal         Fundus Exam       Right Left   Posterior Vitreous  Normal   Disc  Superior pole of nerve has shunt, collateralization.  No N/V.   C/D Ratio  0.3   Macula  Microaneurysms, with focal retinal thickening and focal exudates temporally.   Vessels  Tortuous,, with macular branch retinal vein occlusion   Periphery  Normal            IMAGING AND PROCEDURES  Imaging and Procedures for 10/08/21  Intravitreal Injection, Pharmacologic Agent - OS - Left Eye       Time Out 10/08/2021. 1:57 PM. Confirmed correct patient, procedure, site, and patient consented.   Anesthesia Topical anesthesia was used. Anesthetic medications included Akten 3.5%.   Procedure Preparation included Tobramycin 0.3%, Ofloxacin , 10% betadine to eyelids, 5% betadine to ocular surface. A 30 gauge needle was used.   Injection: 2 mg aflibercept 2 MG/0.05ML   Route: Intravitreal, Site: Left Eye   NDC: L6038910, Lot: [id number], Waste: 0 mL   Post-op Post injection exam found visual acuity of at least counting fingers. The patient tolerated the procedure well. There were no complications. The patient received written and verbal post procedure care education. Post injection medications included ocuflox.      OCT, Retina - OU - Both Eyes       Right Eye Quality was good. Scan locations included subfoveal. Central Foveal Thickness: 297. Progression has been stable. Findings include normal foveal contour.   Left  Eye Quality was good. Scan locations included subfoveal. Central Foveal Thickness: 349. Progression has worsened. Findings include abnormal foveal contour, cystoid macular edema.   Notes OS, with less CME from  macular BRVO as compared to last visit, post Eylea at 8-week interval today.  Vastly improved as compared to May, with much less center involved CME, we will repeat her vitreal Eylea today, and resolved subfoveal serous elevation               ASSESSMENT/PLAN:  Branch retinal vein occlusion with macular edema of left eye The nature of branch retinal vein occlusion with macular edema was discussed.  The patient was given access to printed information.  The treatment options including continued observation looking for spontaneous resolution versus grid laser versus intravitreal Kenalog injection were discussed.  PRIMARY THERAPY CONSISTS of Anti-VEGF Therapies, AVASTIN, LUCENTIS AND EYLEA.  Their usage was discussed to assist in halting the progression of Macular Edema, in order to preserve, protect or improve acuity.  Additionally, at times, limited focal laser therapy is used in the management.  The risks and benefits of all these options were discussed with the patient.  The patient's questions were answered. OS with improved CME overall at 9-week post injection follow-up.  Also and resolved subfoveal serous retinal detachment as compared to recurrences developed April 2022     ICD-10-CM   1. Branch retinal vein occlusion with macular edema of left eye  H34.8320 Intravitreal Injection, Pharmacologic Agent - OS - Left Eye    OCT, Retina - OU - Both Eyes    aflibercept (EYLEA) SOLN 2 mg      1.  OS overall improved post injection Eylea some 9 weeks previous.  Vastly improved since restart of therapy April 2022.  Will repeat injection today and maintain 9-week interval at this time  2.  3.  Ophthalmic Meds Ordered this visit:  Meds ordered this encounter  Medications    aflibercept (EYLEA) SOLN 2 mg       Return in about 9 weeks (around 12/10/2021) for DILATE OU, EYLEA OCT, OS.  There are no Patient Instructions on file for this visit.   Explained the diagnoses, plan, and follow up with the patient and they expressed understanding.  Patient expressed understanding of the importance of proper follow up care.   Alford Highland Mililani Murthy M.D. Diseases & Surgery of the Retina and Vitreous [facility] 10/08/21     Abbreviations: M myopia (nearsighted); A astigmatism; H hyperopia (farsighted); P presbyopia; Mrx spectacle prescription;  CTL contact lenses; OD right eye; OS left eye; OU both eyes  XT exotropia; ET esotropia; PEK punctate epithelial keratitis; PEE punctate epithelial erosions; DES dry eye syndrome; MGD meibomian gland dysfunction; ATs artificial tears; PFAT's preservative free artificial tears; NSC nuclear sclerotic cataract; PSC posterior subcapsular cataract; ERM epi-retinal membrane; PVD posterior vitreous detachment; RD retinal detachment; DM diabetes mellitus; DR diabetic retinopathy; NPDR non-proliferative diabetic retinopathy; PDR proliferative diabetic retinopathy; CSME clinically significant macular edema; DME diabetic macular edema; dbh dot blot hemorrhages; CWS cotton wool spot; POAG primary open angle glaucoma; C/D cup-to-disc ratio; HVF humphrey visual field; GVF goldmann visual field; OCT optical coherence tomography; IOP intraocular pressure; BRVO Branch retinal vein occlusion; CRVO central retinal vein occlusion; CRAO central retinal artery occlusion; BRAO branch retinal artery occlusion; RT retinal tear; SB scleral buckle; PPV pars plana vitrectomy; VH Vitreous hemorrhage; PRP panretinal laser photocoagulation; IVK intravitreal kenalog; VMT vitreomacular traction; MH Macular hole;  NVD neovascularization of the disc; NVE neovascularization elsewhere; AREDS age related eye disease study; ARMD age related macular degeneration; POAG  primary open angle glaucoma; EBMD epithelial/anterior basement membrane dystrophy; ACIOL anterior chamber intraocular lens; IOL intraocular  lens; PCIOL posterior chamber intraocular lens; Phaco/IOL phacoemulsification with intraocular lens placement; [place] photorefractive keratectomy; LASIK laser assisted in situ keratomileusis; HTN hypertension; DM diabetes mellitus; COPD chronic obstructive pulmonary disease

## 2021-10-08 NOTE — Assessment & Plan Note (Signed)
The nature of branch retinal vein occlusion with macular edema was discussed.  The patient was given access to printed information.  The treatment options including continued observation looking for spontaneous resolution versus grid laser versus intravitreal Kenalog injection were discussed.  PRIMARY THERAPY CONSISTS of Anti-VEGF Therapies, AVASTIN, LUCENTIS AND EYLEA.  Their usage was discussed to assist in halting the progression of Macular Edema, in order to preserve, protect or improve acuity.  Additionally, at times, limited focal laser therapy is used in the management.  The risks and benefits of all these options were discussed with the patient.  The patient's questions were answered. OS with improved CME overall at 9-week post injection follow-up.  Also and resolved subfoveal serous retinal detachment as compared to recurrences developed April 2022

## 2021-10-10 DIAGNOSIS — M810 Age-related osteoporosis without current pathological fracture: Secondary | ICD-10-CM | POA: Diagnosis not present

## 2021-10-10 DIAGNOSIS — Z01419 Encounter for gynecological examination (general) (routine) without abnormal findings: Secondary | ICD-10-CM | POA: Diagnosis not present

## 2021-10-10 DIAGNOSIS — Z1212 Encounter for screening for malignant neoplasm of rectum: Secondary | ICD-10-CM | POA: Diagnosis not present

## 2021-10-10 DIAGNOSIS — I10 Essential (primary) hypertension: Secondary | ICD-10-CM | POA: Diagnosis not present

## 2021-11-26 ENCOUNTER — Other Ambulatory Visit: Payer: Self-pay | Admitting: Cardiovascular Disease

## 2021-12-10 ENCOUNTER — Ambulatory Visit (INDEPENDENT_AMBULATORY_CARE_PROVIDER_SITE_OTHER): Payer: Medicare HMO | Admitting: Ophthalmology

## 2021-12-10 ENCOUNTER — Other Ambulatory Visit: Payer: Self-pay

## 2021-12-10 ENCOUNTER — Encounter (INDEPENDENT_AMBULATORY_CARE_PROVIDER_SITE_OTHER): Payer: Self-pay | Admitting: Ophthalmology

## 2021-12-10 DIAGNOSIS — H35372 Puckering of macula, left eye: Secondary | ICD-10-CM | POA: Diagnosis not present

## 2021-12-10 DIAGNOSIS — H34832 Tributary (branch) retinal vein occlusion, left eye, with macular edema: Secondary | ICD-10-CM

## 2021-12-10 DIAGNOSIS — H35352 Cystoid macular degeneration, left eye: Secondary | ICD-10-CM

## 2021-12-10 MED ORDER — AFLIBERCEPT 2MG/0.05ML IZ SOLN FOR KALEIDOSCOPE
2.0000 mg | INTRAVITREAL | Status: AC | PRN
Start: 2021-12-10 — End: 2021-12-10
  Administered 2021-12-10: 14:00:00 2 mg via INTRAVITREAL

## 2021-12-10 NOTE — Assessment & Plan Note (Signed)
Mild none foveal distorting, on the temporal aspect of the fovea

## 2021-12-10 NOTE — Assessment & Plan Note (Signed)
OS, much less thickening temporally, at 9-week interval.  Fovea protected.  We will repeat injection today and extend interval to 10 weeks to decrease treatment burden

## 2021-12-10 NOTE — Assessment & Plan Note (Signed)
Component of BRVO, minor today less active today at 9-week interval post injection

## 2021-12-10 NOTE — Progress Notes (Signed)
12/10/2021     CHIEF COMPLAINT Patient presents for  Chief Complaint  Patient presents with   Retina Follow Up      HISTORY OF PRESENT ILLNESS: [name] is a 75 y.o. female who presents to the clinic today for:   HPI     Retina Follow Up           Diagnosis: CRVO/BRVO   Laterality: left eye   Onset: 9 weeks ago   Severity: mild   Duration: 9 weeks   Course: stable         Comments   9 week fu OS oct Eylea OS.  Patient states vision is stable and unchanged since last visit. Denies any new floaters or FOL.       Last edited by Edmon Crape, MD on 12/10/2021  1:40 PM.      Referring physician: Merri Brunette, MD [address] [place],  [place] [address]  HISTORICAL INFORMATION:   Selected notes from the MEDICAL RECORD NUMBER       CURRENT MEDICATIONS: Current Outpatient Medications (Ophthalmic Drugs)  Medication Sig   cycloSPORINE (RESTASIS) 0.05 % ophthalmic emulsion Place 1 drop into both eyes 2 (two) times daily.   No current facility-administered medications for this visit. (Ophthalmic Drugs)   Current Outpatient Medications (Other)  Medication Sig   amLODipine (NORVASC) 5 MG tablet Take 1 tablet (5 mg total) by mouth every evening.   cetirizine (ZYRTEC) 10 MG tablet Take 10 mg by mouth as needed.    Cholecalciferol (VITAMIN D3) 2000 units TABS Take 2,000 Units by mouth daily.   denosumab (PROLIA) 60 MG/ML SOLN injection Inject 60 mg into the skin every 6 (six) months. Administer in upper arm, thigh, or abdomen   metoprolol succinate (TOPROL-XL) 50 MG 24 hr tablet TAKE 1 TABLET BY MOUTH EVERY DAY   metroNIDAZOLE (METROGEL) 0.75 % gel Apply 1 application topically 2 (two) times daily. Pt applies to face.   Multiple Vitamins-Calcium (ONE-A-DAY WOMENS PO) Take 1 tablet daily by mouth.   rOPINIRole (REQUIP) 0.5 MG tablet Take 0.25 mg at bedtime by mouth.   telmisartan (MICARDIS) 80 MG tablet Take 80 mg by mouth daily.   No  current facility-administered medications for this visit. (Other)      REVIEW OF SYSTEMS:    ALLERGIES Not on File  PAST MEDICAL HISTORY Past Medical History:  Diagnosis Date   Hypertension    Macular edema    Nuclear sclerotic cataract of left eye 03/27/2020   Osteoporosis    Past Surgical History:  Procedure Laterality Date   ABDOMINAL ADHESION SURGERY  2009   Dr. Colin Benton.  Malrotation - LOA done   ABDOMINAL HYSTERECTOMY     APPENDECTOMY     LAPAROTOMY N/A 07/13/2017   Procedure: EXPLORATORY LAPAROTOMY, ENTEROLYSIS;  Surgeon: Luretha Murphy, MD;  Location: [facility];  Service: General;  Laterality: N/A;    FAMILY HISTORY Family History  Problem Relation Age of Onset   Stroke Mother    Hypertension Mother    Osteoarthritis Mother     SOCIAL HISTORY Social History   Tobacco Use   Smoking status: Never   Smokeless tobacco: Never  Substance Use Topics   Alcohol use: Yes    Alcohol/week: 1.0 standard drink    Types: 1 Glasses of wine per week    Comment: daily   Drug use: No         OPHTHALMIC EXAM:  Base Eye Exam  Visual Acuity (ETDRS)       Right Left   Dist [place] 20/20 20/30 -2         Tonometry (Tonopen, 1:05 PM)       Right Left   Pressure 15 13         Pupils       Pupils Dark Light   Right PERRL 3 2   Left PERRL 3 2         Extraocular Movement       Right Left    Full Full         Neuro/Psych     Oriented x3: Yes   Mood/Affect: Normal         Dilation     Both eyes: 1.0% Mydriacyl, 2.5% Phenylephrine @ 1:05 PM           Slit Lamp and Fundus Exam     External Exam       Right Left   External Normal Normal         Slit Lamp Exam       Right Left   Lids/Lashes Normal Normal   Conjunctiva/Sclera White and quiet White and quiet   Cornea Clear Clear   Anterior Chamber Deep and quiet Deep and quiet   Iris Round and reactive Round and reactive   Lens 2+ Nuclear sclerosis Centered posterior chamber  intraocular lens   Anterior Vitreous Normal Normal         Fundus Exam       Right Left   Posterior Vitreous  Posterior vitreous detachment   Disc  Superior pole of nerve has shunt, collateralization.  No N/V.   C/D Ratio  0.3   Macula  Microaneurysms, with focal retinal thickening and focal exudates temporally., Epiretinal membrane mild   Vessels  Tortuous,, with macular branch retinal vein occlusion   Periphery  Normal            IMAGING AND PROCEDURES  Imaging and Procedures for 12/10/21  Intravitreal Injection, Pharmacologic Agent - OS - Left Eye       Time Out 12/10/2021. 1:40 PM. Confirmed correct patient, procedure, site, and patient consented.   Anesthesia Topical anesthesia was used. Anesthetic medications included Lidocaine 4%.   Procedure Preparation included Tobramycin 0.3%, Ofloxacin , 10% betadine to eyelids, 5% betadine to ocular surface. A 30 gauge needle was used.   Injection: 2 mg aflibercept 2 MG/0.05ML   Route: Intravitreal, Site: Left Eye   NDC: L6038910, Lot: [id number], Waste: 0 mL   Post-op Post injection exam found visual acuity of at least counting fingers. The patient tolerated the procedure well. There were no complications. The patient received written and verbal post procedure care education. Post injection medications included ocuflox.      OCT, Retina - OU - Both Eyes       Right Eye Quality was good. Scan locations included subfoveal. Central Foveal Thickness: 297. Progression has been stable. Findings include normal foveal contour.   Left Eye Quality was good. Scan locations included subfoveal. Central Foveal Thickness: 354. Progression has been stable. Findings include abnormal foveal contour, cystoid macular edema, epiretinal membrane.   Notes OS, with less CME from macular BRVO temporally as compared to last visit, post Eylea at 9-week interval today.  Vastly improved as compared to May, with much less center involved  CME, we will repeat her vitreal Eylea today, and resolved subfoveal serous elevation  ASSESSMENT/PLAN:  Epiretinal membrane, left eye Mild none foveal distorting, on the temporal aspect of the fovea  Cystoid macular edema of left eye Component of BRVO, minor today less active today at 9-week interval post injection  Branch retinal vein occlusion with macular edema of left eye OS, much less thickening temporally, at 9-week interval.  Fovea protected.  We will repeat injection today and extend interval to 10 weeks to decrease treatment burden     ICD-10-CM   1. Branch retinal vein occlusion with macular edema of left eye  H34.8320 Intravitreal Injection, Pharmacologic Agent - OS - Left Eye    OCT, Retina - OU - Both Eyes    aflibercept (EYLEA) SOLN 2 mg    2. Epiretinal membrane, left eye  H35.372     3. Cystoid macular edema of left eye  H35.352       1.  OS well treated BRVO with less CME overall currently at 9-week interval.  Repeat injection OS today to maintain, follow-up next in 9 to 10 weeks  2.  OD with excellent acuity mild NSC changes  3.  Ophthalmic Meds Ordered this visit:  Meds ordered this encounter  Medications   aflibercept (EYLEA) SOLN 2 mg       Return in about 10 weeks (around 02/18/2022) for dilate, OS, EYLEA OCT.  There are no Patient Instructions on file for this visit.   Explained the diagnoses, plan, and follow up with the patient and they expressed understanding.  Patient expressed understanding of the importance of proper follow up care.   Alford Highland Cathey Fredenburg M.D. Diseases & Surgery of the Retina and Vitreous [facility] 12/10/21     Abbreviations: M myopia (nearsighted); A astigmatism; H hyperopia (farsighted); P presbyopia; Mrx spectacle prescription;  CTL contact lenses; OD right eye; OS left eye; OU both eyes  XT exotropia; ET esotropia; PEK punctate epithelial keratitis; PEE punctate epithelial  erosions; DES dry eye syndrome; MGD meibomian gland dysfunction; ATs artificial tears; PFAT's preservative free artificial tears; NSC nuclear sclerotic cataract; PSC posterior subcapsular cataract; ERM epi-retinal membrane; PVD posterior vitreous detachment; RD retinal detachment; DM diabetes mellitus; DR diabetic retinopathy; NPDR non-proliferative diabetic retinopathy; PDR proliferative diabetic retinopathy; CSME clinically significant macular edema; DME diabetic macular edema; dbh dot blot hemorrhages; CWS cotton wool spot; POAG primary open angle glaucoma; C/D cup-to-disc ratio; HVF humphrey visual field; GVF goldmann visual field; OCT optical coherence tomography; IOP intraocular pressure; BRVO Branch retinal vein occlusion; CRVO central retinal vein occlusion; CRAO central retinal artery occlusion; BRAO branch retinal artery occlusion; RT retinal tear; SB scleral buckle; PPV pars plana vitrectomy; VH Vitreous hemorrhage; PRP panretinal laser photocoagulation; IVK intravitreal kenalog; VMT vitreomacular traction; MH Macular hole;  NVD neovascularization of the disc; NVE neovascularization elsewhere; AREDS age related eye disease study; ARMD age related macular degeneration; POAG primary open angle glaucoma; EBMD epithelial/anterior basement membrane dystrophy; ACIOL anterior chamber intraocular lens; IOL intraocular lens; PCIOL posterior chamber intraocular lens; Phaco/IOL phacoemulsification with intraocular lens placement; PRK photorefractive keratectomy; LASIK laser assisted in situ keratomileusis; HTN hypertension; DM diabetes mellitus; COPD chronic obstructive pulmonary disease

## 2022-02-07 ENCOUNTER — Encounter (INDEPENDENT_AMBULATORY_CARE_PROVIDER_SITE_OTHER): Payer: Self-pay

## 2022-02-07 DIAGNOSIS — H26492 Other secondary cataract, left eye: Secondary | ICD-10-CM | POA: Diagnosis not present

## 2022-02-07 DIAGNOSIS — Z961 Presence of intraocular lens: Secondary | ICD-10-CM | POA: Diagnosis not present

## 2022-02-07 DIAGNOSIS — H40013 Open angle with borderline findings, low risk, bilateral: Secondary | ICD-10-CM | POA: Diagnosis not present

## 2022-02-07 DIAGNOSIS — H2511 Age-related nuclear cataract, right eye: Secondary | ICD-10-CM | POA: Diagnosis not present

## 2022-02-18 ENCOUNTER — Encounter (INDEPENDENT_AMBULATORY_CARE_PROVIDER_SITE_OTHER): Payer: Self-pay | Admitting: Ophthalmology

## 2022-02-18 ENCOUNTER — Other Ambulatory Visit: Payer: Self-pay

## 2022-02-18 ENCOUNTER — Ambulatory Visit (INDEPENDENT_AMBULATORY_CARE_PROVIDER_SITE_OTHER): Payer: Medicare HMO | Admitting: Ophthalmology

## 2022-02-18 DIAGNOSIS — H35372 Puckering of macula, left eye: Secondary | ICD-10-CM | POA: Diagnosis not present

## 2022-02-18 DIAGNOSIS — H34832 Tributary (branch) retinal vein occlusion, left eye, with macular edema: Secondary | ICD-10-CM | POA: Diagnosis not present

## 2022-02-18 MED ORDER — AFLIBERCEPT 2MG/0.05ML IZ SOLN FOR KALEIDOSCOPE
2.0000 mg | INTRAVITREAL | Status: AC | PRN
  Administered 2022-02-18: 2 mg via INTRAVITREAL

## 2022-02-18 NOTE — Assessment & Plan Note (Signed)
Macular edema now limited to temporal to FAZ, consider involvement of the FAZ has been improved on intravitreal Eylea currently at follow-up interval of planned 10 weeks, repeat injection today and follow-up next in 12 weeks

## 2022-02-18 NOTE — Progress Notes (Signed)
02/18/2022     CHIEF COMPLAINT Patient presents for  Chief Complaint  Patient presents with   Branch Retinal Vein Occlusion      HISTORY OF PRESENT ILLNESS: [name] is a 76 y.o. female who presents to the clinic today for:   HPI   BRVO with macular edema OS, 10 weeks dilate OS, Eylea OS, oct. Patient states vision is stable and unchanged since last visit. Denies any new floaters or FOL.  Last edited by Nelva Nay on 02/18/2022  1:29 PM.      Referring physician: Merri Brunette, MD [address] [place],  [place] [address]  HISTORICAL INFORMATION:   Selected notes from the MEDICAL RECORD NUMBER       CURRENT MEDICATIONS: Current Outpatient Medications (Ophthalmic Drugs)  Medication Sig   cycloSPORINE (RESTASIS) 0.05 % ophthalmic emulsion Place 1 drop into both eyes 2 (two) times daily.   No current facility-administered medications for this visit. (Ophthalmic Drugs)   Current Outpatient Medications (Other)  Medication Sig   amLODipine (NORVASC) 5 MG tablet Take 1 tablet (5 mg total) by mouth every evening.   cetirizine (ZYRTEC) 10 MG tablet Take 10 mg by mouth as needed.    Cholecalciferol (VITAMIN D3) 2000 units TABS Take 2,000 Units by mouth daily.   denosumab (PROLIA) 60 MG/ML SOLN injection Inject 60 mg into the skin every 6 (six) months. Administer in upper arm, thigh, or abdomen   metoprolol succinate (TOPROL-XL) 50 MG 24 hr tablet TAKE 1 TABLET BY MOUTH EVERY DAY   metroNIDAZOLE (METROGEL) 0.75 % gel Apply 1 application topically 2 (two) times daily. Pt applies to face.   Multiple Vitamins-Calcium (ONE-A-DAY WOMENS PO) Take 1 tablet daily by mouth.   rOPINIRole (REQUIP) 0.5 MG tablet Take 0.25 mg at bedtime by mouth.   telmisartan (MICARDIS) 80 MG tablet Take 80 mg by mouth daily.   No current facility-administered medications for this visit. (Other)      REVIEW OF SYSTEMS: ROS   Negative for: Constitutional, Gastrointestinal,  Neurological, Skin, Genitourinary, Musculoskeletal, HENT, Endocrine, Cardiovascular, Eyes, Respiratory, Psychiatric, Allergic/Imm, Heme/Lymph Last edited by Edmon Crape, MD on 02/18/2022  2:16 PM.       ALLERGIES Not on File  PAST MEDICAL HISTORY Past Medical History:  Diagnosis Date   Hypertension    Macular edema    Nuclear sclerotic cataract of left eye 03/27/2020   Osteoporosis    Past Surgical History:  Procedure Laterality Date   ABDOMINAL ADHESION SURGERY  2009   Dr. Colin Benton.  Malrotation - LOA done   ABDOMINAL HYSTERECTOMY     APPENDECTOMY     LAPAROTOMY N/A 07/13/2017   Procedure: EXPLORATORY LAPAROTOMY, ENTEROLYSIS;  Surgeon: Luretha Murphy, MD;  Location: [facility];  Service: General;  Laterality: N/A;    FAMILY HISTORY Family History  Problem Relation Age of Onset   Stroke Mother    Hypertension Mother    Osteoarthritis Mother     SOCIAL HISTORY Social History   Tobacco Use   Smoking status: Never   Smokeless tobacco: Never  Substance Use Topics   Alcohol use: Yes    Alcohol/week: 1.0 standard drink    Types: 1 Glasses of wine per week    Comment: daily   Drug use: No         OPHTHALMIC EXAM:  Base Eye Exam     Visual Acuity (ETDRS)       Right Left   Dist [place] 20/20 -1 20/30 +  2         Tonometry (Tonopen, 1:32 PM)       Right Left   Pressure 17 12         Pupils       Pupils Dark Light APD   Right PERRL 3 2 None   Left PERRL 3 2 None         Extraocular Movement       Right Left    Full Full         Neuro/Psych     Oriented x3: Yes   Mood/Affect: Normal         Dilation     Left eye: 1.0% Mydriacyl, 2.5% Phenylephrine @ 1:32 PM           Slit Lamp and Fundus Exam     External Exam       Right Left   External Normal Normal         Slit Lamp Exam       Right Left   Lids/Lashes Normal Normal   Conjunctiva/Sclera White and quiet White and quiet   Cornea Clear Clear   Anterior Chamber Deep and  quiet Deep and quiet   Iris Round and reactive Round and reactive   Lens 2+ Nuclear sclerosis Centered posterior chamber intraocular lens   Anterior Vitreous Normal Normal         Fundus Exam       Right Left   Posterior Vitreous  Posterior vitreous detachment   Disc  Superior pole of nerve has shunt, collateralization.  No N/V.   C/D Ratio  0.3   Macula  Microaneurysms, with focal retinal thickening and focal exudates temporally., Epiretinal membrane mild   Vessels  Tortuous, with macular branch retinal vein occlusion   Periphery  Normal            IMAGING AND PROCEDURES  Imaging and Procedures for 02/18/22  Intravitreal Injection, Pharmacologic Agent - OS - Left Eye       Time Out 02/18/2022. 2:17 PM. Confirmed correct patient, procedure, site, and patient consented.   Anesthesia Topical anesthesia was used. Anesthetic medications included Lidocaine 4%.   Procedure Preparation included Tobramycin 0.3%, Ofloxacin , 10% betadine to eyelids, 5% betadine to ocular surface. A 30 gauge needle was used.   Injection: 2 mg aflibercept 2 MG/0.05ML   Route: Intravitreal, Site: Left Eye   NDC: L6038910, Lot: [id number], Waste: 0 mL   Post-op Post injection exam found visual acuity of at least counting fingers. The patient tolerated the procedure well. There were no complications. The patient received written and verbal post procedure care education. Post injection medications included ocuflox.      OCT, Retina - OU - Both Eyes       Right Eye Quality was good. Scan locations included subfoveal. Central Foveal Thickness: 297. Progression has been stable. Findings include normal foveal contour.   Left Eye Quality was good. Scan locations included subfoveal. Central Foveal Thickness: 346. Progression has been stable. Findings include abnormal foveal contour, cystoid macular edema, epiretinal membrane.   Notes OS, with less CME from macular BRVO temporally as compared  to last visit, post Eylea at 9-week interval today.  Vastly improved as compared to May, with much less center involved CME, we will repeat her vitreal Eylea today, and resolved subfoveal serous elevation               ASSESSMENT/PLAN:  Branch retinal vein occlusion with macular edema of  left eye Macular edema now limited to temporal to FAZ, consider involvement of the FAZ has been improved on intravitreal Eylea currently at follow-up interval of planned 10 weeks, repeat injection today and follow-up next in 12 weeks  Epiretinal membrane, left eye Epiretinal membrane left eye will always keep the thickening present temporally but not impactful of vision and no foveal involvement, it is disclosed and discussed with patient, continue observe     ICD-10-CM   1. Branch retinal vein occlusion with macular edema of left eye  H34.8320 Intravitreal Injection, Pharmacologic Agent - OS - Left Eye    OCT, Retina - OU - Both Eyes    aflibercept (EYLEA) SOLN 2 mg    2. Epiretinal membrane, left eye  H35.372       1.  Repeat injection today intravitreal Eylea, and 10-week interval, will repeat examination in 12 weeks  2.  3.  Ophthalmic Meds Ordered this visit:  Meds ordered this encounter  Medications   aflibercept (EYLEA) SOLN 2 mg       Return in about 12 weeks (around 05/13/2022) for dilate, OS, EYLEA OCT.  There are no Patient Instructions on file for this visit.   Explained the diagnoses, plan, and follow up with the patient and they expressed understanding.  Patient expressed understanding of the importance of proper follow up care.   Alford Highland Vivan Vanderveer M.D. Diseases & Surgery of the Retina and Vitreous [facility] 02/18/22     Abbreviations: M myopia (nearsighted); A astigmatism; H hyperopia (farsighted); P presbyopia; Mrx spectacle prescription;  CTL contact lenses; OD right eye; OS left eye; OU both eyes  XT exotropia; ET esotropia; PEK punctate  epithelial keratitis; PEE punctate epithelial erosions; DES dry eye syndrome; MGD meibomian gland dysfunction; ATs artificial tears; PFAT's preservative free artificial tears; NSC nuclear sclerotic cataract; PSC posterior subcapsular cataract; ERM epi-retinal membrane; PVD posterior vitreous detachment; RD retinal detachment; DM diabetes mellitus; DR diabetic retinopathy; NPDR non-proliferative diabetic retinopathy; PDR proliferative diabetic retinopathy; CSME clinically significant macular edema; DME diabetic macular edema; dbh dot blot hemorrhages; CWS cotton wool spot; POAG primary open angle glaucoma; C/D cup-to-disc ratio; HVF humphrey visual field; GVF goldmann visual field; OCT optical coherence tomography; IOP intraocular pressure; BRVO Branch retinal vein occlusion; CRVO central retinal vein occlusion; CRAO central retinal artery occlusion; BRAO branch retinal artery occlusion; RT retinal tear; SB scleral buckle; PPV pars plana vitrectomy; VH Vitreous hemorrhage; PRP panretinal laser photocoagulation; IVK intravitreal kenalog; VMT vitreomacular traction; MH Macular hole;  NVD neovascularization of the disc; NVE neovascularization elsewhere; AREDS age related eye disease study; ARMD age related macular degeneration; POAG primary open angle glaucoma; EBMD epithelial/anterior basement membrane dystrophy; ACIOL anterior chamber intraocular lens; IOL intraocular lens; PCIOL posterior chamber intraocular lens; Phaco/IOL phacoemulsification with intraocular lens placement; PRK photorefractive keratectomy; LASIK laser assisted in situ keratomileusis; HTN hypertension; DM diabetes mellitus; COPD chronic obstructive pulmonary disease

## 2022-02-18 NOTE — Assessment & Plan Note (Signed)
Epiretinal membrane left eye will always keep the thickening present temporally but not impactful of vision and no foveal involvement, it is disclosed and discussed with patient, continue observe

## 2022-04-17 DIAGNOSIS — M810 Age-related osteoporosis without current pathological fracture: Secondary | ICD-10-CM | POA: Diagnosis not present

## 2022-05-13 ENCOUNTER — Encounter (INDEPENDENT_AMBULATORY_CARE_PROVIDER_SITE_OTHER): Payer: Self-pay | Admitting: Ophthalmology

## 2022-05-13 ENCOUNTER — Ambulatory Visit (INDEPENDENT_AMBULATORY_CARE_PROVIDER_SITE_OTHER): Payer: Medicare HMO | Admitting: Ophthalmology

## 2022-05-13 DIAGNOSIS — H35372 Puckering of macula, left eye: Secondary | ICD-10-CM

## 2022-05-13 DIAGNOSIS — H2511 Age-related nuclear cataract, right eye: Secondary | ICD-10-CM | POA: Diagnosis not present

## 2022-05-13 DIAGNOSIS — H34832 Tributary (branch) retinal vein occlusion, left eye, with macular edema: Secondary | ICD-10-CM | POA: Diagnosis not present

## 2022-05-13 MED ORDER — AFLIBERCEPT 2MG/0.05ML IZ SOLN FOR KALEIDOSCOPE
2.0000 mg | INTRAVITREAL | Status: AC | PRN
  Administered 2022-05-13: 2 mg via INTRAVITREAL

## 2022-05-13 NOTE — Assessment & Plan Note (Addendum)
The nature of branch retinal vein occlusion with macular edema was discussed.  The patient was given access to printed information.  The treatment options including continued observation looking for spontaneous resolution versus grid laser versus intravitreal Kenalog injection were discussed.  PRIMARY THERAPY CONSISTS of Anti-VEGF Therapies, AVASTIN, LUCENTIS AND EYLEA.  Their usage was discussed to assist in halting the progression of Macular Edema, in order to preserve, protect or improve acuity.  Additionally, at times, limited focal laser therapy is used in the management.  The risks and benefits of all these options were discussed with the patient.  The patient's questions were answered.  BRVO with center involved CME.  Still slightly active but still much improved as compared to last year's onset April 2022.  Currently at 12-week follow-up.  We will treat today with Eylea and extend interval slightly to 14 weeks in order to minimize treatment burden

## 2022-05-13 NOTE — Progress Notes (Signed)
05/13/2022     CHIEF COMPLAINT Patient presents for  Chief Complaint  Patient presents with   Retina Evaluation      HISTORY OF PRESENT ILLNESS: [name] is a 76 y.o. female who presents to the clinic today for:     Referring physician: Merri BrunettePharr, Walter, MD [address] [place] [id number]  HISTORICAL INFORMATION:   Selected notes from the MEDICAL RECORD NUMBER       CURRENT MEDICATIONS: Current Outpatient Medications (Ophthalmic Drugs)  Medication Sig   cycloSPORINE (RESTASIS) 0.05 % ophthalmic emulsion Place 1 drop into both eyes 2 (two) times daily.   No current facility-administered medications for this visit. (Ophthalmic Drugs)   Current Outpatient Medications (Other)  Medication Sig   amLODipine (NORVASC) 5 MG tablet Take 1 tablet (5 mg total) by mouth every evening.   cetirizine (ZYRTEC) 10 MG tablet Take 10 mg by mouth as needed.    Cholecalciferol (VITAMIN D3) 2000 units TABS Take 2,000 Units by mouth daily.   denosumab (PROLIA) 60 MG/ML SOLN injection Inject 60 mg into the skin every 6 (six) months. Administer in upper arm, thigh, or abdomen   metoprolol succinate (TOPROL-XL) 50 MG 24 hr tablet TAKE 1 TABLET BY MOUTH EVERY DAY   metroNIDAZOLE (METROGEL) 0.75 % gel Apply 1 application topically 2 (two) times daily. Pt applies to face.   Multiple Vitamins-Calcium (ONE-A-DAY WOMENS PO) Take 1 tablet daily by mouth.   rOPINIRole (REQUIP) 0.5 MG tablet Take 0.25 mg at bedtime by mouth.   telmisartan (MICARDIS) 80 MG tablet Take 80 mg by mouth daily.   No current facility-administered medications for this visit. (Other)      REVIEW OF SYSTEMS: ROS   Negative for: Constitutional, Gastrointestinal, Neurological, Skin, Genitourinary, Musculoskeletal, HENT, Endocrine, Cardiovascular, Eyes, Respiratory, Psychiatric, Allergic/Imm, Heme/Lymph Last edited by Edmon Crapeankin, Alena Blankenbeckler A, MD on 05/13/2022  1:34 PM.       ALLERGIES Not on File  PAST  MEDICAL HISTORY Past Medical History:  Diagnosis Date   Hypertension    Macular edema    Nuclear sclerotic cataract of left eye 03/27/2020   Osteoporosis    Past Surgical History:  Procedure Laterality Date   ABDOMINAL ADHESION SURGERY  2009   Dr. Colin BentonEarle.  Malrotation - LOA done   ABDOMINAL HYSTERECTOMY     APPENDECTOMY     LAPAROTOMY N/A 07/13/2017   Procedure: EXPLORATORY LAPAROTOMY, ENTEROLYSIS;  Surgeon: Luretha MurphyMartin, Matthew, MD;  Location: [facility];  Service: General;  Laterality: N/A;    FAMILY HISTORY Family History  Problem Relation Age of Onset   Stroke Mother    Hypertension Mother    Osteoarthritis Mother     SOCIAL HISTORY Social History   Tobacco Use   Smoking status: Never   Smokeless tobacco: Never  Substance Use Topics   Alcohol use: Yes    Alcohol/week: 1.0 standard drink    Types: 1 Glasses of wine per week    Comment: daily   Drug use: No         OPHTHALMIC EXAM:  Base Eye Exam     Visual Acuity (ETDRS)       Right Left   Dist [place] 20/20 20/40 +2         Pupils       Pupils React   Right PERRL Brisk   Left PERRL Brisk         Visual Fields       Left Right  Full Full         Dilation     Left eye: 1.0% Mydriacyl, 2.5% Phenylephrine @ 1:36 PM            IMAGING AND PROCEDURES  Imaging and Procedures for 05/13/22  OCT, Retina - OU - Both Eyes       Right Eye Quality was good. Scan locations included subfoveal. Central Foveal Thickness: 299. Progression has been stable. Findings include normal foveal contour.   Left Eye Quality was good. Scan locations included subfoveal. Central Foveal Thickness: 334. Progression has been stable. Findings include abnormal foveal contour, cystoid macular edema, epiretinal membrane.   Notes OS, with less CME from macular BRVO temporally as compared to last visit, post Eylea at 12-week interval today.  Vastly improved as compared to May, with much less center involved CME, we will  repeat her vitreal Eylea today, and resolved subfoveal serous elevation       Intravitreal Injection, Pharmacologic Agent - OS - Left Eye       Time Out 05/13/2022. 1:40 PM. Confirmed correct patient, procedure, site, and patient consented.   Anesthesia Topical anesthesia was used. Anesthetic medications included Lidocaine 4%.   Procedure Preparation included Tobramycin 0.3%, Ofloxacin , 10% betadine to eyelids, 5% betadine to ocular surface. A 30 gauge needle was used.   Injection: 2 mg aflibercept 2 MG/0.05ML   Route: Intravitreal, Site: Left Eye   NDC: L6038910, Lot: [id number], Waste: 0 mL   Post-op Post injection exam found visual acuity of at least counting fingers. The patient tolerated the procedure well. There were no complications. The patient received written and verbal post procedure care education. Post injection medications included ocuflox.              ASSESSMENT/PLAN:  Nuclear sclerotic cataract of right eye Follow-up with [facility] eye care as scheduled  Epiretinal membrane, left eye Mild no impact on acuity  Branch retinal vein occlusion with macular edema of left eye The nature of branch retinal vein occlusion with macular edema was discussed.  The patient was given access to printed information.  The treatment options including continued observation looking for spontaneous resolution versus grid laser versus intravitreal Kenalog injection were discussed.  PRIMARY THERAPY CONSISTS of Anti-VEGF Therapies, AVASTIN, LUCENTIS AND EYLEA.  Their usage was discussed to assist in halting the progression of Macular Edema, in order to preserve, protect or improve acuity.  Additionally, at times, limited focal laser therapy is used in the management.  The risks and benefits of all these options were discussed with the patient.  The patient's questions were answered.  BRVO with center involved CME.  Still slightly active but still much improved as compared to last  year's onset April 2022.  Currently at 12-week follow-up.  We will treat today with Eylea and extend interval slightly to 14 weeks in order to minimize treatment burden     ICD-10-CM   1. Branch retinal vein occlusion with macular edema of left eye  H34.8320 OCT, Retina - OU - Both Eyes    Intravitreal Injection, Pharmacologic Agent - OS - Left Eye    aflibercept (EYLEA) SOLN 2 mg    2. Epiretinal membrane, left eye  H35.372 OCT, Retina - OU - Both Eyes    3. Nuclear sclerotic cataract of right eye  H25.11       1.  OS improved CME from BRVO.  After recurrence developed in April 2022.  Now at 12-week follow-up post injection was still some  residual CME but overall improved.  We will repeat injection today to maintain the improvement and also extend interval next to 14 weeks  2.  3.  Ophthalmic Meds Ordered this visit:  Meds ordered this encounter  Medications   aflibercept (EYLEA) SOLN 2 mg       Return in about 14 weeks (around 08/19/2022) for dilate, OS, EYLEA OCT.  There are no Patient Instructions on file for this visit.   Explained the diagnoses, plan, and follow up with the patient and they expressed understanding.  Patient expressed understanding of the importance of proper follow up care.   Alford Highland Roxie Gueye M.D. Diseases & Surgery of the Retina and Vitreous [facility] 05/13/22     Abbreviations: M myopia (nearsighted); A astigmatism; H hyperopia (farsighted); P presbyopia; Mrx spectacle prescription;  CTL contact lenses; OD right eye; OS left eye; OU both eyes  XT exotropia; ET esotropia; PEK punctate epithelial keratitis; PEE punctate epithelial erosions; DES dry eye syndrome; MGD meibomian gland dysfunction; ATs artificial tears; PFAT's preservative free artificial tears; NSC nuclear sclerotic cataract; PSC posterior subcapsular cataract; ERM epi-retinal membrane; PVD posterior vitreous detachment; RD retinal detachment; DM diabetes mellitus; DR  diabetic retinopathy; NPDR non-proliferative diabetic retinopathy; PDR proliferative diabetic retinopathy; CSME clinically significant macular edema; DME diabetic macular edema; dbh dot blot hemorrhages; CWS cotton wool spot; POAG primary open angle glaucoma; C/D cup-to-disc ratio; HVF humphrey visual field; GVF goldmann visual field; OCT optical coherence tomography; IOP intraocular pressure; BRVO Branch retinal vein occlusion; CRVO central retinal vein occlusion; CRAO central retinal artery occlusion; BRAO branch retinal artery occlusion; RT retinal tear; SB scleral buckle; PPV pars plana vitrectomy; VH Vitreous hemorrhage; PRP panretinal laser photocoagulation; IVK intravitreal kenalog; VMT vitreomacular traction; MH Macular hole;  NVD neovascularization of the disc; NVE neovascularization elsewhere; AREDS age related eye disease study; ARMD age related macular degeneration; POAG primary open angle glaucoma; EBMD epithelial/anterior basement membrane dystrophy; ACIOL anterior chamber intraocular lens; IOL intraocular lens; PCIOL posterior chamber intraocular lens; Phaco/IOL phacoemulsification with intraocular lens placement; PRK photorefractive keratectomy; LASIK laser assisted in situ keratomileusis; HTN hypertension; DM diabetes mellitus; COPD chronic obstructive pulmonary disease

## 2022-05-13 NOTE — Assessment & Plan Note (Signed)
Mild no impact on acuity

## 2022-05-13 NOTE — Assessment & Plan Note (Signed)
Follow-up with [facility] as scheduled 

## 2022-08-03 ENCOUNTER — Other Ambulatory Visit: Payer: Self-pay | Admitting: Cardiovascular Disease

## 2022-08-19 ENCOUNTER — Ambulatory Visit (INDEPENDENT_AMBULATORY_CARE_PROVIDER_SITE_OTHER): Payer: Medicare HMO | Admitting: Ophthalmology

## 2022-08-19 ENCOUNTER — Encounter (INDEPENDENT_AMBULATORY_CARE_PROVIDER_SITE_OTHER): Payer: Self-pay | Admitting: Ophthalmology

## 2022-08-19 DIAGNOSIS — H35372 Puckering of macula, left eye: Secondary | ICD-10-CM

## 2022-08-19 DIAGNOSIS — H35342 Macular cyst, hole, or pseudohole, left eye: Secondary | ICD-10-CM | POA: Diagnosis not present

## 2022-08-19 DIAGNOSIS — H34832 Tributary (branch) retinal vein occlusion, left eye, with macular edema: Secondary | ICD-10-CM

## 2022-08-19 MED ORDER — AFLIBERCEPT 2MG/0.05ML IZ SOLN FOR KALEIDOSCOPE
2.0000 mg | INTRAVITREAL | Status: AC | PRN
  Administered 2022-08-19: 2 mg via INTRAVITREAL

## 2022-08-19 NOTE — Assessment & Plan Note (Addendum)
The nature of branch retinal vein occlusion with macular edema was discussed.  The patient was given access to printed information.  The treatment options including continued observation looking for spontaneous resolution versus grid laser versus intravitreal Kenalog injection were discussed.  PRIMARY THERAPY CONSISTS of Anti-VEGF Therapies, AVASTIN, LUCENTIS AND EYLEA.  Their usage was discussed to assist in halting the progression of Macular Edema, in order to preserve, protect or improve acuity.  Additionally, at times, limited focal laser therapy is used in the management.  The risks and benefits of all these options were discussed with the patient.  The patient's questions were answered.  Currently at 14-week follow-up.  We will repeat injection today follow-up again in 4 months and evaluate likely no injection

## 2022-08-19 NOTE — Progress Notes (Signed)
08/19/2022     CHIEF COMPLAINT Patient presents for  Chief Complaint  Patient presents with   Branch Retinal Vein Occlusion      HISTORY OF PRESENT ILLNESS: [name] is a 76 y.o. female who presents to the clinic today for:   HPI   14 weeks dilate os eylea oct, OS for CME secondary to BRVO with history recurrences. Pt states her vision has been stable  Pt denies any new floaters or FOL   Last edited by Edmon Crape, MD on 08/19/2022  2:18 PM.      Referring physician: Merri Brunette, MD [address] [place],  [place] [address]  HISTORICAL INFORMATION:   Selected notes from the MEDICAL RECORD NUMBER       CURRENT MEDICATIONS: Current Outpatient Medications (Ophthalmic Drugs)  Medication Sig   cycloSPORINE (RESTASIS) 0.05 % ophthalmic emulsion Place 1 drop into both eyes 2 (two) times daily.   No current facility-administered medications for this visit. (Ophthalmic Drugs)   Current Outpatient Medications (Other)  Medication Sig   amLODipine (NORVASC) 5 MG tablet TAKE 1 TABLET BY MOUTH EVERY DAY IN THE EVENING   cetirizine (ZYRTEC) 10 MG tablet Take 10 mg by mouth as needed.    Cholecalciferol (VITAMIN D3) 2000 units TABS Take 2,000 Units by mouth daily.   denosumab (PROLIA) 60 MG/ML SOLN injection Inject 60 mg into the skin every 6 (six) months. Administer in upper arm, thigh, or abdomen   metoprolol succinate (TOPROL-XL) 50 MG 24 hr tablet TAKE 1 TABLET BY MOUTH EVERY DAY   metroNIDAZOLE (METROGEL) 0.75 % gel Apply 1 application topically 2 (two) times daily. Pt applies to face.   Multiple Vitamins-Calcium (ONE-A-DAY WOMENS PO) Take 1 tablet daily by mouth.   rOPINIRole (REQUIP) 0.5 MG tablet Take 0.25 mg at bedtime by mouth.   telmisartan (MICARDIS) 80 MG tablet Take 80 mg by mouth daily.   No current facility-administered medications for this visit. (Other)      REVIEW OF SYSTEMS: ROS   Negative for: Constitutional, Gastrointestinal,  Neurological, Skin, Genitourinary, Musculoskeletal, HENT, Endocrine, Cardiovascular, Eyes, Respiratory, Psychiatric, Allergic/Imm, Heme/Lymph Last edited by Aleene Davidson, CMA on 08/19/2022  1:24 PM.       ALLERGIES Not on File  PAST MEDICAL HISTORY Past Medical History:  Diagnosis Date   Hypertension    Macular edema    Nuclear sclerotic cataract of left eye 03/27/2020   Osteoporosis    Past Surgical History:  Procedure Laterality Date   ABDOMINAL ADHESION SURGERY  2009   Dr. Colin Benton.  Malrotation - LOA done   ABDOMINAL HYSTERECTOMY     APPENDECTOMY     LAPAROTOMY N/A 07/13/2017   Procedure: EXPLORATORY LAPAROTOMY, ENTEROLYSIS;  Surgeon: Luretha Murphy, MD;  Location: [facility];  Service: General;  Laterality: N/A;    FAMILY HISTORY Family History  Problem Relation Age of Onset   Stroke Mother    Hypertension Mother    Osteoarthritis Mother     SOCIAL HISTORY Social History   Tobacco Use   Smoking status: Never   Smokeless tobacco: Never  Substance Use Topics   Alcohol use: Yes    Alcohol/week: 1.0 standard drink of alcohol    Types: 1 Glasses of wine per week    Comment: daily   Drug use: No         OPHTHALMIC EXAM:  Base Eye Exam     Visual Acuity (ETDRS)       Right Left  Dist [place] 20/20 -1 20/30         Tonometry (Tonopen, 1:29 PM)       Right Left   Pressure 20 17         Pupils       Pupils   Right PERRL   Left PERRL         Visual Fields       Left Right    Full Full         Extraocular Movement       Right Left    Ortho Ortho    -- -- --  --  --  -- -- --   -- -- --  --  --  -- -- --           Neuro/Psych     Oriented x3: Yes   Mood/Affect: Normal         Dilation     Left eye: 2.5% Phenylephrine, 1.0% Mydriacyl @ 1:26 PM           Slit Lamp and Fundus Exam     External Exam       Right Left   External Normal Normal         Slit Lamp Exam       Right Left   Lids/Lashes Normal  Normal   Conjunctiva/Sclera White and quiet White and quiet   Cornea Clear Clear   Anterior Chamber Deep and quiet Deep and quiet   Iris Round and reactive Round and reactive   Lens 2+ Nuclear sclerosis Centered posterior chamber intraocular lens   Anterior Vitreous Normal Normal         Fundus Exam       Right Left   Posterior Vitreous  Posterior vitreous detachment   Disc  Superior pole of nerve has shunt, collateralization.  No N/V.   C/D Ratio  0.3   Macula  Microaneurysms, with focal retinal thickening and focal exudates temporally., Epiretinal membrane mild   Vessels  Tortuous, with macular branch retinal vein occlusion   Periphery  Normal            IMAGING AND PROCEDURES  Imaging and Procedures for 08/19/22  OCT, Retina - OU - Both Eyes       Right Eye Quality was good. Scan locations included subfoveal. Central Foveal Thickness: 299. Progression has been stable. Findings include normal foveal contour.   Left Eye Quality was good. Scan locations included subfoveal. Central Foveal Thickness: 334. Progression has been stable. Findings include abnormal foveal contour, cystoid macular edema, epiretinal membrane.   Notes OS, with less CME from macular BRVO temporally as compared to last visit, post Eylea at 14-week interval today.  Vastly improved as compared to May, with much less center involved CME, we will repeat her vitreal Eylea today, and resolved subfoveal serous elevation       Intravitreal Injection, Pharmacologic Agent - OS - Left Eye       Time Out 08/19/2022. 2:12 PM. Confirmed correct patient, procedure, site, and patient consented.   Anesthesia Topical anesthesia was used. Anesthetic medications included Lidocaine 4%.   Procedure Preparation included 5% betadine to ocular surface, 10% betadine to eyelids, Tobramycin 0.3%, Ofloxacin . A 30 gauge needle was used.   Injection: 2 mg aflibercept 2 MG/0.05ML   Route: Intravitreal, Site: Left  Eye   NDC: L6038910, Lot: [id number], Expiration date: 03/24/2023, Waste: 0 mL   Post-op Post injection exam found visual acuity of at least  counting fingers. The patient tolerated the procedure well. There were no complications. The patient received written and verbal post procedure care education. Post injection medications included ocuflox.              ASSESSMENT/PLAN:  Branch retinal vein occlusion with macular edema of left eye The nature of branch retinal vein occlusion with macular edema was discussed.  The patient was given access to printed information.  The treatment options including continued observation looking for spontaneous resolution versus grid laser versus intravitreal Kenalog injection were discussed.  PRIMARY THERAPY CONSISTS of Anti-VEGF Therapies, AVASTIN, LUCENTIS AND EYLEA.  Their usage was discussed to assist in halting the progression of Macular Edema, in order to preserve, protect or improve acuity.  Additionally, at times, limited focal laser therapy is used in the management.  The risks and benefits of all these options were discussed with the patient.  The patient's questions were answered.  Currently at 14-week follow-up.  We will repeat injection today follow-up again in 4 months and evaluate likely no injection  Epiretinal membrane, left eye The nature of macular pucker (epiretinal membrane ERM) was discussed with the patient as well as threshold criteria for vitrectomy surgery. I explained that in rare cases another surgery is needed to actually remove a second wrinkle should it regrow.  Most often, the epiretinal membrane and underlying wrinkled internal limiting membrane are removed with the first surgery, to accomplish the goals.   If the operative eye is Phakic (natural lens still present), cataract surgery is often recommended prior to Vitrectomy. This will enable the retina surgeon to have the best view during surgery and the patient to obtain optimal  results in the future. Treatment options were discussed.  I have recommended at home monitoring the near vision task in a monocular (1 eye at a time), with or without near vision glasses, to look for changes or declines in reading.  Some role in outer retinal gap early macular hole, not operable at this time  Lamellar macular hole, left eye Early stage macular hole, outer retinal gap, likely due to tangential traction of epiretinal membrane, no progression substantially over time we will continue to monitor and watch.  If continues to worsen may need to intervene     ICD-10-CM   1. Branch retinal vein occlusion with macular edema of left eye  H34.8320 OCT, Retina - OU - Both Eyes    Intravitreal Injection, Pharmacologic Agent - OS - Left Eye    aflibercept (EYLEA) SOLN 2 mg    2. Epiretinal membrane, left eye  H35.372     3. Lamellar macular hole, left eye  H35.342       1.  OS stable over time at 14 weeks post most recent injection.  Stable acuity.  No encroachment of CME temporally onto the fovea.  We will repeat injection today for stability and extend interval examination next to 4 months  2.  Outer retinal gap at the photoreceptor layer could be an early macular hole from tangential traction from ERM we will continue to monitor and observe  3.  Ophthalmic Meds Ordered this visit:  Meds ordered this encounter  Medications   aflibercept (EYLEA) SOLN 2 mg       Return in about 4 months (around 12/19/2022) for DILATE OU, OCT, no planned injection.  There are no Patient Instructions on file for this visit.   Explained the diagnoses, plan, and follow up with the patient and they expressed understanding.  Patient  expressed understanding of the importance of proper follow up care.   Alford Highland Dannisha Eckmann M.D. Diseases & Surgery of the Retina and Vitreous [facility] 08/19/22     Abbreviations: M myopia (nearsighted); A astigmatism; H hyperopia (farsighted); P  presbyopia; Mrx spectacle prescription;  CTL contact lenses; OD right eye; OS left eye; OU both eyes  XT exotropia; ET esotropia; PEK punctate epithelial keratitis; PEE punctate epithelial erosions; DES dry eye syndrome; MGD meibomian gland dysfunction; ATs artificial tears; PFAT's preservative free artificial tears; NSC nuclear sclerotic cataract; PSC posterior subcapsular cataract; ERM epi-retinal membrane; PVD posterior vitreous detachment; RD retinal detachment; DM diabetes mellitus; DR diabetic retinopathy; NPDR non-proliferative diabetic retinopathy; PDR proliferative diabetic retinopathy; CSME clinically significant macular edema; DME diabetic macular edema; dbh dot blot hemorrhages; CWS cotton wool spot; POAG primary open angle glaucoma; C/D cup-to-disc ratio; HVF humphrey visual field; GVF goldmann visual field; OCT optical coherence tomography; IOP intraocular pressure; BRVO Branch retinal vein occlusion; CRVO central retinal vein occlusion; CRAO central retinal artery occlusion; BRAO branch retinal artery occlusion; RT retinal tear; SB scleral buckle; PPV pars plana vitrectomy; VH Vitreous hemorrhage; PRP panretinal laser photocoagulation; IVK intravitreal kenalog; VMT vitreomacular traction; MH Macular hole;  NVD neovascularization of the disc; NVE neovascularization elsewhere; AREDS age related eye disease study; ARMD age related macular degeneration; POAG primary open angle glaucoma; EBMD epithelial/anterior basement membrane dystrophy; ACIOL anterior chamber intraocular lens; IOL intraocular lens; PCIOL posterior chamber intraocular lens; Phaco/IOL phacoemulsification with intraocular lens placement; PRK photorefractive keratectomy; LASIK laser assisted in situ keratomileusis; HTN hypertension; DM diabetes mellitus; COPD chronic obstructive pulmonary disease

## 2022-08-19 NOTE — Assessment & Plan Note (Signed)
Early stage macular hole, outer retinal gap, likely due to tangential traction of epiretinal membrane, no progression substantially over time we will continue to monitor and watch.  If continues to worsen may need to intervene

## 2022-08-19 NOTE — Assessment & Plan Note (Addendum)
The nature of macular pucker (epiretinal membrane ERM) was discussed with the patient as well as threshold criteria for vitrectomy surgery. I explained that in rare cases another surgery is needed to actually remove a second wrinkle should it regrow.  Most often, the epiretinal membrane and underlying wrinkled internal limiting membrane are removed with the first surgery, to accomplish the goals.   If the operative eye is Phakic (natural lens still present), cataract surgery is often recommended prior to Vitrectomy. This will enable the retina surgeon to have the best view during surgery and the patient to obtain optimal results in the future. Treatment options were discussed.  I have recommended at home monitoring the near vision task in a monocular (1 eye at a time), with or without near vision glasses, to look for changes or declines in reading.  Some role in outer retinal gap early macular hole, not operable at this time

## 2022-09-06 ENCOUNTER — Other Ambulatory Visit: Payer: Self-pay | Admitting: Internal Medicine

## 2022-09-06 DIAGNOSIS — Z1231 Encounter for screening mammogram for malignant neoplasm of breast: Secondary | ICD-10-CM

## 2022-10-02 DIAGNOSIS — M810 Age-related osteoporosis without current pathological fracture: Secondary | ICD-10-CM | POA: Diagnosis not present

## 2022-10-02 DIAGNOSIS — I10 Essential (primary) hypertension: Secondary | ICD-10-CM | POA: Diagnosis not present

## 2022-10-02 DIAGNOSIS — E7800 Pure hypercholesterolemia, unspecified: Secondary | ICD-10-CM | POA: Diagnosis not present

## 2022-10-09 DIAGNOSIS — Z0000 Encounter for general adult medical examination without abnormal findings: Secondary | ICD-10-CM | POA: Diagnosis not present

## 2022-10-09 DIAGNOSIS — M810 Age-related osteoporosis without current pathological fracture: Secondary | ICD-10-CM | POA: Diagnosis not present

## 2022-10-09 DIAGNOSIS — I471 Supraventricular tachycardia, unspecified: Secondary | ICD-10-CM | POA: Diagnosis not present

## 2022-10-09 DIAGNOSIS — G2581 Restless legs syndrome: Secondary | ICD-10-CM | POA: Diagnosis not present

## 2022-10-09 DIAGNOSIS — I8390 Asymptomatic varicose veins of unspecified lower extremity: Secondary | ICD-10-CM | POA: Diagnosis not present

## 2022-10-09 DIAGNOSIS — Z23 Encounter for immunization: Secondary | ICD-10-CM | POA: Diagnosis not present

## 2022-10-09 DIAGNOSIS — Z8679 Personal history of other diseases of the circulatory system: Secondary | ICD-10-CM | POA: Diagnosis not present

## 2022-10-09 DIAGNOSIS — I10 Essential (primary) hypertension: Secondary | ICD-10-CM | POA: Diagnosis not present

## 2022-10-09 DIAGNOSIS — Z5320 Procedure and treatment not carried out because of patient's decision for unspecified reasons: Secondary | ICD-10-CM | POA: Diagnosis not present

## 2022-10-09 DIAGNOSIS — E871 Hypo-osmolality and hyponatremia: Secondary | ICD-10-CM | POA: Diagnosis not present

## 2022-10-14 DIAGNOSIS — Z1212 Encounter for screening for malignant neoplasm of rectum: Secondary | ICD-10-CM | POA: Diagnosis not present

## 2022-10-14 DIAGNOSIS — Z01419 Encounter for gynecological examination (general) (routine) without abnormal findings: Secondary | ICD-10-CM | POA: Diagnosis not present

## 2022-10-29 DIAGNOSIS — Z1231 Encounter for screening mammogram for malignant neoplasm of breast: Secondary | ICD-10-CM

## 2022-11-26 DIAGNOSIS — M810 Age-related osteoporosis without current pathological fracture: Secondary | ICD-10-CM | POA: Diagnosis not present

## 2022-11-27 ENCOUNTER — Ambulatory Visit: Payer: Medicare HMO

## 2022-12-19 ENCOUNTER — Encounter (INDEPENDENT_AMBULATORY_CARE_PROVIDER_SITE_OTHER): Payer: Medicare HMO | Admitting: Ophthalmology

## 2022-12-31 ENCOUNTER — Encounter (INDEPENDENT_AMBULATORY_CARE_PROVIDER_SITE_OTHER): Payer: Medicare HMO | Admitting: Ophthalmology

## 2022-12-31 DIAGNOSIS — H35032 Hypertensive retinopathy, left eye: Secondary | ICD-10-CM | POA: Diagnosis not present

## 2022-12-31 DIAGNOSIS — H35352 Cystoid macular degeneration, left eye: Secondary | ICD-10-CM | POA: Diagnosis not present

## 2022-12-31 DIAGNOSIS — H34832 Tributary (branch) retinal vein occlusion, left eye, with macular edema: Secondary | ICD-10-CM | POA: Diagnosis not present

## 2022-12-31 DIAGNOSIS — H2511 Age-related nuclear cataract, right eye: Secondary | ICD-10-CM | POA: Diagnosis not present

## 2022-12-31 DIAGNOSIS — H35372 Puckering of macula, left eye: Secondary | ICD-10-CM | POA: Diagnosis not present

## 2023-03-03 ENCOUNTER — Encounter: Payer: Self-pay | Admitting: Cardiovascular Disease

## 2023-03-03 ENCOUNTER — Ambulatory Visit: Payer: Medicare HMO | Attending: Cardiovascular Disease | Admitting: Cardiovascular Disease

## 2023-03-03 DIAGNOSIS — I341 Nonrheumatic mitral (valve) prolapse: Secondary | ICD-10-CM

## 2023-03-03 DIAGNOSIS — I8393 Asymptomatic varicose veins of bilateral lower extremities: Secondary | ICD-10-CM | POA: Diagnosis not present

## 2023-03-03 DIAGNOSIS — I10 Essential (primary) hypertension: Secondary | ICD-10-CM | POA: Diagnosis not present

## 2023-03-03 DIAGNOSIS — R002 Palpitations: Secondary | ICD-10-CM | POA: Diagnosis not present

## 2023-03-03 DIAGNOSIS — G2581 Restless legs syndrome: Secondary | ICD-10-CM

## 2023-03-03 NOTE — Progress Notes (Incomplete)
Cardiology Office Note    Date:  03/03/2023   ID:  [name], DOB 1946-12-23, MRN [id number]  PCP:  Deland Pretty, MD  Cardiologist:  Shelva Majestic, MD   19 month F/U cardiology evaluation initially referred by Dr. Junius Roads.  History of Present Illness:  [name] is a 77 y.o. female who is the wife of Dr. Marveen Reeks, a retired rheumatologist.  I saw her for initial cardiology evaluation November 08, 2019.  She presents for a 15  month follow-up evaluation.  [name] is followed by Dr. Shelia Media and was recently evaluated with symptoms of increasing palpitations.  She has a history of Raynaud's syndrome, hypertension on Micardis, restless leg syndrome, and osteoporosis.  Because of episodes of palpitations, she had worn a Zio patch from September 30 through September 29, 2019 and was monitored for 6 days and 21 hours.  The predominant rhythm was sinus rhythm with an average rate at 77 bpm.  Her minimum heart rate was sinus bradycardia at 49 bpm and maximum heart rate was 162 bpm.  She was found to have 2 episodes of supraventricular tachycardia with the fastest interval lasting 11 beats with a maximum rate at 162 bpm.  There were rare isolated PACs and couplets.  Isolated PVCs were occasional at 4.1% rare ventricular couplet without triplets.  There also were episodes of ventricular bigeminy and trigeminy.   She has a history hypertension for at least 15 years.  In addition she has a history of retinal branch vein occlusion of her left eye.  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  She denies any chest pain.  She denies any presyncope or syncope.   When I initially saw her on November 08, 2019, her physical examination was highly suggestive of mitral valve prolapse with multiple systolic clicks and MR murmur at the apex.  I reviewed her Zio patch monitor.  I scheduled her for an echo Doppler study which confirmed my suspicion and revealed normal LV  function.  Her mitral valve was mildly myxomatous with mitral valve prolapse and mild mitral regurgitation.  She had mildly increased RV systolic pressure at [id number].  I recommended initiation of Toprol-XL 25 mg which would be helpful both for her labile blood pressure as well as ectopy suppression.  Since initiating Toprol she has felt significantly improved.  She still notes occasional episodes of intermittent heart rate irregularity.  She denies any presyncope or syncope.  She denies any chest pain.  Laboratory revealed TSH 3.2.  Chemistry was normal with exception of minimal glucose increased at 107.    I saw her in January 2021.  At that time, her palpitations had improved with her Toprol regimen.  Pressure continues to be mildly elevated.  I recommended titration of Toprol-XL to 37.5 mg for 1 week and then to increase this to 50 mg daily.  She continued be on amlodipine 2.5 mg and telmisartan 80 mg.  I saw her in May 2021.  Over the prior 4 months she felt significantly improved on her Toprol regimen.  She denied  any awareness of any palpitations.  Her blood pressure has now been well controlled.  She denied shortness of breath.  She deniedpalpitations.  She denied abdominal pain.  She sees Dr. Deland Pretty for primary care who checks laboratory.  I recommended that she continue her multidrug regimen consisting of amlodipine 2.5 mg and Toprol-XL 50 mg in addition to telmisartan 80 mg daily.  Since  I last saw her, she denies any chest pain or shortness of breath.  She has issues with bilateral lower extremity varicose veins.  She is unaware of palpitations.  She believes her blood pressure at home has been fairly stable.  She presents for evaluation.  Past Medical History:  Diagnosis Date   Hypertension    Macular edema    Nuclear sclerotic cataract of left eye 03/27/2020   Osteoporosis     Past Surgical History:  Procedure Laterality Date   ABDOMINAL ADHESION SURGERY  2009   Dr. Zettie Pho.   Malrotation - LOA done   ABDOMINAL HYSTERECTOMY     APPENDECTOMY     LAPAROTOMY N/A 07/13/2017   Procedure: EXPLORATORY LAPAROTOMY, ENTEROLYSIS;  Surgeon: Johnathan Hausen, MD;  Location: [facility];  Service: General;  Laterality: N/A;    Current Medications: Outpatient Medications Prior to Visit  Medication Sig Dispense Refill   amLODipine (NORVASC) 5 MG tablet TAKE 1 TABLET BY MOUTH EVERY DAY IN THE EVENING 90 tablet 3   cetirizine (ZYRTEC) 10 MG tablet Take 10 mg by mouth as needed.      Cholecalciferol (VITAMIN D3) 2000 units TABS Take 2,000 Units by mouth daily.     denosumab (PROLIA) 60 MG/ML SOLN injection Inject 60 mg into the skin every 6 (six) months. Administer in upper arm, thigh, or abdomen     metoprolol succinate (TOPROL-XL) 50 MG 24 hr tablet TAKE 1 TABLET BY MOUTH EVERY DAY 90 tablet 2   metroNIDAZOLE (METROGEL) 0.75 % gel Apply 1 application topically 2 (two) times daily. Pt applies to face.     rOPINIRole (REQUIP) 0.5 MG tablet Take 0.25 mg at bedtime by mouth.  4   telmisartan (MICARDIS) 80 MG tablet Take 80 mg by mouth daily.     Multiple Vitamins-Calcium (ONE-A-DAY WOMENS PO) Take 1 tablet daily by mouth.     cycloSPORINE (RESTASIS) 0.05 % ophthalmic emulsion Place 1 drop into both eyes 2 (two) times daily.     No facility-administered medications prior to visit.     Allergies:   Patient has no allergy information on record.   Social History   Socioeconomic History   Marital status: Married    Spouse name: Not on file   Number of children: Not on file   Years of education: Not on file   Highest education level: Not on file  Occupational History   Not on file  Tobacco Use   Smoking status: Never   Smokeless tobacco: Never  Substance and Sexual Activity   Alcohol use: Yes    Alcohol/week: 1.0 standard drink of alcohol    Types: 1 Glasses of wine per week    Comment: daily   Drug use: No   Sexual activity: Never  Other Topics Concern   Not on file  Social  History Narrative   Not on file   Social Determinants of Health   Financial Resource Strain: Not on file  Food Insecurity: Not on file  Transportation Needs: Not on file  Physical Activity: Not on file  Stress: Not on file  Social Connections: Not on file    Socially she is married to Dr. Marveen Reeks for 50 years.  She has 4 children and 2 grandchildren.  She has a Scientist, water quality.  She drinks occasional red wine.  She walks for approximately half hour 4 days/week.  There is no tobacco history.  Family History:  The patient's family history includes Hypertension in her mother; Osteoarthritis in her mother;  Stroke in her mother.   Parents are deceased.  Her mother died at age 84 and had a history of atrial fibrillation, stroke and congestive heart failure.  Her father died at age 72 and had a subarachnoid hemorrhage and died suddenly.  ROS General: Negative; No fevers, chills, or night sweats;  HEENT: History of retinal branch vein occlusion of her left eye; No changes in vision or hearing, sinus congestion, difficulty swallowing Pulmonary: Negative; No cough, wheezing, shortness of breath, hemoptysis Cardiovascular: Negative; No chest pain, presyncope, syncope, palpitations GI: Congenital malrotation of her intestines GU: Negative; No dysuria, hematuria, or difficulty voiding Musculoskeletal: Negative; no myalgias, joint pain, or weakness Hematologic/Oncology: Negative; no easy bruising, bleeding Endocrine: Negative; no heat/cold intolerance; no diabetes Neuro: History of rosacea Psychiatric: Negative; No behavioral problems, depression Sleep: History of restless legs on requip;  No snoring, daytime sleepiness, hypersomnolence, bruxism, restless legs, hypnogognic hallucinations, no cataplexy Other comprehensive 14 point system review is negative.   PHYSICAL EXAM:   VS:  BP 130/70   Pulse 68   Ht 5' 5.5" (1.664 m)   Wt 112 lb 12.8 oz (51.2 kg)   SpO2 98%   BMI 18.49 kg/m      Repeat blood pressure by me was elevated at 170/90  Wt Readings from Last 3 Encounters:  03/03/23 112 lb 12.8 oz (51.2 kg)  07/31/21 112 lb 9.6 oz (51.1 kg)  05/16/20 112 lb 12.8 oz (51.2 kg)    General: Alert, oriented, no distress.  Skin: normal turgor, no rashes, warm and dry HEENT: Normocephalic, atraumatic. Pupils equal round and reactive to light; sclera anicteric; extraocular muscles intact;  Nose without nasal septal hypertrophy Mouth/Parynx benign; Mallinpatti scale 2 Neck: No JVD, no carotid bruits; normal carotid upstroke Lungs: clear to ausculatation and percussion; no wheezing or rales Chest wall: without tenderness to palpitation Heart: PMI not displaced, RRR, s1 s2 normal, [id number] systolic murmur at apex consistent with mitral regurgitation, systolic click, no diastolic murmur, no rubs, gallops, thrills, or heaves Abdomen: soft, nontender; no hepatosplenomehaly, BS+; abdominal aorta nontender and not dilated by palpation. Back: no CVA tenderness Pulses 2+ Musculoskeletal: full range of motion, normal strength, no joint deformities Extremities: no clubbing cyanosis or edema, Homan's sign negative  Neurologic: grossly nonfocal; Cranial nerves grossly wnl Psychologic: Normal mood and affect    Studies/Labs Reviewed:   March 03, 2023 ECG (independently read by me): NSR at 68, PVC, nonspecifiic T wave abnormality    July 31, 2021 ECG (independently read by me):  NSR at 63; mild RV conduction delay  May 2021 ECG (independently read by me): Sinus bradycardia at 56 bpm.  No ectopy.  Normal intervals.  January 2021 ECG (independently read by me): Normal sinus rhythm at 70; mild RV conduction delay     November 2020 ECG (independently read by me): Sinus tachycardia at 104 with frequent PVCs and PAC.  Recent Labs:    Latest Ref Rng & Units 11/08/2019    3:28 PM 10/31/2017    5:11 AM 10/30/2017    5:38 AM  BMP  Glucose 65 - 99 mg/dL 107  89  111   BUN 8 - 27 mg/dL '15   9  12   '$ Creatinine 0.57 - 1.00 mg/dL 0.61  0.41  0.50   BUN/Creat Ratio 12 - 28 25     Sodium 134 - 144 mmol/L 136  141  143   Potassium 3.5 - 5.2 mmol/L 4.5  3.5  3.7   Chloride 96 -  106 mmol/L 94  109  110   CO2 20 - 29 mmol/L '24  26  28   '$ Calcium 8.7 - 10.3 mg/dL 9.6  8.0  8.2         Latest Ref Rng & Units 10/28/2017    1:42 PM 07/10/2017   12:02 PM 10/18/2008    4:50 AM  Hepatic Function  Total Protein 6.5 - 8.1 g/dL 8.7  8.5  8.2   Albumin 3.5 - 5.0 g/dL 5.1  4.9  4.7   AST 15 - 41 U/L 41  39  41   ALT 14 - 54 U/L 44  24  37   Alk Phosphatase 38 - 126 U/L 103  56  79   Total Bilirubin 0.3 - 1.2 mg/dL 1.0  3.0  1.6        Latest Ref Rng & Units 10/29/2017    5:47 AM 10/28/2017    1:42 PM 07/17/2017    5:31 AM  CBC  WBC 4.0 - 10.5 K/uL 9.4  13.1  6.6   Hemoglobin 12.0 - 15.0 g/dL 13.0  14.1  11.5   Hematocrit 36.0 - 46.0 % 38.3  41.1  32.3   Platelets 150 - 400 K/uL 256  290  278    Lab Results  Component Value Date   MCV 93.2 10/29/2017   MCV 92.6 10/28/2017   MCV 89.7 07/17/2017   Lab Results  Component Value Date   TSH 3.240 11/08/2019   No results found for: "HGBA1C"   BNP No results found for: "BNP"  ProBNP No results found for: "PROBNP"   Lipid Panel  No results found for: "CHOL", "TRIG", "HDL", "CHOLHDL", "VLDL", "LDLCALC", "LDLDIRECT", "LABVLDL"   RADIOLOGY: No results found.   Additional studies/ records that were reviewed today include:  Reviewed the records of Dr. Deland Pretty.  I reviewed the Zio patch monitor.  Laboratory from Dr. Junius Roads of September 17, 2019: Hemoglobin 13.3, hematocrit 39.3.  Platelets 220 2K Glucose 94, BUN 11 creatinine 0.6.  LFTs normal.  GFR 103 Cholesterol 229, triglycerides 66, HDL 92, LDL 12  ECHO 11/17/2019 1. Left ventricular ejection fraction, by visual estimation, is 60 to 65%. The left ventricle has normal function. There is no left ventricular hypertrophy. 2. The left ventricle has no regional  wall motion abnormalities. 3. Global right ventricle has normal systolic function.The right ventricular size is normal. No increase in right ventricular wall thickness. 4. Left atrial size was normal. 5. Right atrial size was normal. 6. Mild mitral valve prolapse. 7. The mitral valve is myxomatous. Mild mitral valve regurgitation. No evidence of mitral stenosis. 8. The tricuspid valve is normal in structure. Tricuspid valve regurgitation mild-moderate. 9. The aortic valve is normal in structure. Aortic valve regurgitation is not visualized. No evidence of aortic valve sclerosis or stenosis. 10. The pulmonic valve was normal in structure. Pulmonic valve regurgitation is not visualized. 11. Mildly elevated pulmonary artery systolic pressure. 12. The tricuspid regurgitant velocity is 2.68 m/s, and with an assumed right atrial pressure of 3 mmHg, the estimated right ventricular systolic pressure is mildly elevated at 31.7 mmHg. 13. The inferior vena cava is normal in size with greater  ASSESSMENT:    No diagnosis found.   PLAN:  [name] is a 77 year old female who was referred by Dr. Junius Roads for palpitations.  She has a longstanding history of hypertension  and when I initially saw her was on a regimen of amlodipine 2.5 mg in  addition to telmisartan 80 mg daily.  She was significantly hypertensive and on cardiac monitoring she was found to have frequent PVCs as well as 2 episodes of supraventricular tachycardia with the fastest interval lasting 11 beats with a maximum rate at 162 bpm.  She also had several ventricular couplets as well as isolated PACs.  When I initially saw her she had stage II hypertension.  Her palpitations have significantly improved with initiation of metoprolol succinate.  Evaluation made 2021 on increased Toprol regimen of 50 mg daily, her palpitations had completely resolved.  Presently, her blood pressure is elevated and I suspect she has a significant labile  component.  She states her blood pressure at home is typically stable.  However with her blood pressure 170/90 I have recommended titration of her amlodipine from 2.5 mg up to 5 mg daily.  She will continue with her telmisartan 80 mg in addition to metoprolol succinate 50 mg daily.  She is not having any chest pain or palpitations.  She is not having any presyncope or syncope.  She has a longstanding history of lower extremity varicose veins bilaterally.  She is on Reglan for restless legs.  She will be seeing Dr. Deland Pretty next month and laboratory will be obtained.  She will continue to monitor her blood pressure on her increased regimen.  I will see her in 1 year for reevaluation or sooner as needed.  Medication Adjustments/Labs and Tests Ordered: Current medicines are reviewed at length with the patient today.  Concerns regarding medicines are outlined above.  Medication changes, Labs and Tests ordered today are listed in the Patient Instructions below. There are no Patient Instructions on file for this visit.   Signed, Shelva Majestic, MD  03/03/2023 12:33 PM    [facility] [address] [place], [place], [place]  [address] Phone: [phone]

## 2023-03-03 NOTE — Patient Instructions (Signed)
Medication Instructions:  No changes   *If you need a refill on your cardiac medications before your next appointment, please call your pharmacy*   Lab Work: Not needed    Testing/Procedures: Not needed   Follow-Up: At [facility], you and your health needs are our priority.  As part of our continuing mission to provide you with exceptional heart care, we have created designated Provider Care Teams.  These Care Teams include your primary Cardiologist (physician) and Advanced Practice Providers (APPs -  Physician Assistants and Nurse Practitioners) who all work together to provide you with the care you need, when you need it.     Your next appointment:   12 month(s)  The format for your next appointment:   In Person  Provider:   Thomas Kelly, MD    

## 2023-03-04 ENCOUNTER — Encounter: Payer: Self-pay | Admitting: Cardiovascular Disease

## 2023-03-31 DIAGNOSIS — H35372 Puckering of macula, left eye: Secondary | ICD-10-CM | POA: Diagnosis not present

## 2023-03-31 DIAGNOSIS — H35342 Macular cyst, hole, or pseudohole, left eye: Secondary | ICD-10-CM | POA: Diagnosis not present

## 2023-03-31 DIAGNOSIS — H34832 Tributary (branch) retinal vein occlusion, left eye, with macular edema: Secondary | ICD-10-CM | POA: Diagnosis not present

## 2023-04-13 ENCOUNTER — Other Ambulatory Visit: Payer: Self-pay | Admitting: Cardiovascular Disease

## 2023-06-03 DIAGNOSIS — M810 Age-related osteoporosis without current pathological fracture: Secondary | ICD-10-CM | POA: Diagnosis not present

## 2023-07-14 ENCOUNTER — Other Ambulatory Visit: Payer: Self-pay | Admitting: Cardiovascular Disease

## 2023-07-29 DIAGNOSIS — H2511 Age-related nuclear cataract, right eye: Secondary | ICD-10-CM | POA: Diagnosis not present

## 2023-07-29 DIAGNOSIS — H35352 Cystoid macular degeneration, left eye: Secondary | ICD-10-CM | POA: Diagnosis not present

## 2023-07-29 DIAGNOSIS — H34832 Tributary (branch) retinal vein occlusion, left eye, with macular edema: Secondary | ICD-10-CM | POA: Diagnosis not present

## 2023-07-29 DIAGNOSIS — H35342 Macular cyst, hole, or pseudohole, left eye: Secondary | ICD-10-CM | POA: Diagnosis not present

## 2023-07-29 DIAGNOSIS — H35032 Hypertensive retinopathy, left eye: Secondary | ICD-10-CM | POA: Diagnosis not present

## 2023-07-29 DIAGNOSIS — H35372 Puckering of macula, left eye: Secondary | ICD-10-CM | POA: Diagnosis not present

## 2023-07-29 DIAGNOSIS — H26492 Other secondary cataract, left eye: Secondary | ICD-10-CM | POA: Diagnosis not present

## 2023-10-10 DIAGNOSIS — M810 Age-related osteoporosis without current pathological fracture: Secondary | ICD-10-CM | POA: Diagnosis not present

## 2023-10-10 DIAGNOSIS — I10 Essential (primary) hypertension: Secondary | ICD-10-CM | POA: Diagnosis not present

## 2023-10-13 ENCOUNTER — Other Ambulatory Visit: Payer: Self-pay | Admitting: Internal Medicine

## 2023-10-13 DIAGNOSIS — Z1231 Encounter for screening mammogram for malignant neoplasm of breast: Secondary | ICD-10-CM

## 2023-10-15 ENCOUNTER — Other Ambulatory Visit (HOSPITAL_COMMUNITY): Payer: Self-pay | Admitting: Internal Medicine

## 2023-10-15 DIAGNOSIS — I7300 Raynaud's syndrome without gangrene: Secondary | ICD-10-CM | POA: Diagnosis not present

## 2023-10-15 DIAGNOSIS — Z0000 Encounter for general adult medical examination without abnormal findings: Secondary | ICD-10-CM | POA: Diagnosis not present

## 2023-10-15 DIAGNOSIS — I8390 Asymptomatic varicose veins of unspecified lower extremity: Secondary | ICD-10-CM | POA: Diagnosis not present

## 2023-10-15 DIAGNOSIS — Z23 Encounter for immunization: Secondary | ICD-10-CM | POA: Diagnosis not present

## 2023-10-15 DIAGNOSIS — I10 Essential (primary) hypertension: Secondary | ICD-10-CM | POA: Diagnosis not present

## 2023-10-15 DIAGNOSIS — M810 Age-related osteoporosis without current pathological fracture: Secondary | ICD-10-CM | POA: Diagnosis not present

## 2023-10-15 DIAGNOSIS — E7800 Pure hypercholesterolemia, unspecified: Secondary | ICD-10-CM | POA: Diagnosis not present

## 2023-10-22 DIAGNOSIS — Z1212 Encounter for screening for malignant neoplasm of rectum: Secondary | ICD-10-CM | POA: Diagnosis not present

## 2023-10-28 DIAGNOSIS — H35342 Macular cyst, hole, or pseudohole, left eye: Secondary | ICD-10-CM | POA: Diagnosis not present

## 2023-10-28 DIAGNOSIS — H35372 Puckering of macula, left eye: Secondary | ICD-10-CM | POA: Diagnosis not present

## 2023-10-28 DIAGNOSIS — H2511 Age-related nuclear cataract, right eye: Secondary | ICD-10-CM | POA: Diagnosis not present

## 2023-10-28 DIAGNOSIS — H35032 Hypertensive retinopathy, left eye: Secondary | ICD-10-CM | POA: Diagnosis not present

## 2023-10-28 DIAGNOSIS — H35352 Cystoid macular degeneration, left eye: Secondary | ICD-10-CM | POA: Diagnosis not present

## 2023-10-28 DIAGNOSIS — H34832 Tributary (branch) retinal vein occlusion, left eye, with macular edema: Secondary | ICD-10-CM | POA: Diagnosis not present

## 2023-11-10 ENCOUNTER — Ambulatory Visit (HOSPITAL_COMMUNITY)
Admission: RE | Admit: 2023-11-10 | Discharge: 2023-11-10 | Disposition: A | Discharge status: Home / Self Care | Payer: Medicare HMO | Source: Ambulatory Visit | Attending: Internal Medicine | Admitting: Internal Medicine

## 2023-11-10 DIAGNOSIS — Z23 Encounter for immunization: Secondary | ICD-10-CM | POA: Insufficient documentation

## 2023-11-11 DIAGNOSIS — H35342 Macular cyst, hole, or pseudohole, left eye: Secondary | ICD-10-CM | POA: Diagnosis not present

## 2023-11-11 DIAGNOSIS — H34832 Tributary (branch) retinal vein occlusion, left eye, with macular edema: Secondary | ICD-10-CM | POA: Diagnosis not present

## 2023-11-11 DIAGNOSIS — Z1231 Encounter for screening mammogram for malignant neoplasm of breast: Secondary | ICD-10-CM

## 2023-11-26 DIAGNOSIS — I10 Essential (primary) hypertension: Secondary | ICD-10-CM | POA: Diagnosis not present

## 2023-11-26 DIAGNOSIS — I2510 Atherosclerotic heart disease of native coronary artery without angina pectoris: Secondary | ICD-10-CM | POA: Diagnosis not present

## 2024-01-10 ENCOUNTER — Other Ambulatory Visit: Payer: Self-pay | Admitting: Cardiovascular Disease

## 2024-01-15 DIAGNOSIS — I2510 Atherosclerotic heart disease of native coronary artery without angina pectoris: Secondary | ICD-10-CM | POA: Diagnosis not present

## 2024-01-16 DIAGNOSIS — I2510 Atherosclerotic heart disease of native coronary artery without angina pectoris: Secondary | ICD-10-CM | POA: Diagnosis not present

## 2024-01-19 DIAGNOSIS — H2511 Age-related nuclear cataract, right eye: Secondary | ICD-10-CM | POA: Diagnosis not present

## 2024-01-19 DIAGNOSIS — H35032 Hypertensive retinopathy, left eye: Secondary | ICD-10-CM | POA: Diagnosis not present

## 2024-01-19 DIAGNOSIS — H35342 Macular cyst, hole, or pseudohole, left eye: Secondary | ICD-10-CM | POA: Diagnosis not present

## 2024-01-19 DIAGNOSIS — H35372 Puckering of macula, left eye: Secondary | ICD-10-CM | POA: Diagnosis not present

## 2024-01-19 DIAGNOSIS — H34832 Tributary (branch) retinal vein occlusion, left eye, with macular edema: Secondary | ICD-10-CM | POA: Diagnosis not present

## 2024-01-19 DIAGNOSIS — H35352 Cystoid macular degeneration, left eye: Secondary | ICD-10-CM | POA: Diagnosis not present

## 2024-02-27 DIAGNOSIS — I2510 Atherosclerotic heart disease of native coronary artery without angina pectoris: Secondary | ICD-10-CM | POA: Diagnosis not present

## 2024-03-01 DIAGNOSIS — H2511 Age-related nuclear cataract, right eye: Secondary | ICD-10-CM | POA: Diagnosis not present

## 2024-03-01 DIAGNOSIS — H35342 Macular cyst, hole, or pseudohole, left eye: Secondary | ICD-10-CM | POA: Diagnosis not present

## 2024-03-01 DIAGNOSIS — H34832 Tributary (branch) retinal vein occlusion, left eye, with macular edema: Secondary | ICD-10-CM | POA: Diagnosis not present

## 2024-03-01 DIAGNOSIS — H35032 Hypertensive retinopathy, left eye: Secondary | ICD-10-CM | POA: Diagnosis not present

## 2024-03-01 DIAGNOSIS — H35352 Cystoid macular degeneration, left eye: Secondary | ICD-10-CM | POA: Diagnosis not present

## 2024-03-01 DIAGNOSIS — H35372 Puckering of macula, left eye: Secondary | ICD-10-CM | POA: Diagnosis not present

## 2024-04-12 DIAGNOSIS — H35352 Cystoid macular degeneration, left eye: Secondary | ICD-10-CM | POA: Diagnosis not present

## 2024-04-12 DIAGNOSIS — H34832 Tributary (branch) retinal vein occlusion, left eye, with macular edema: Secondary | ICD-10-CM | POA: Diagnosis not present

## 2024-04-12 DIAGNOSIS — H35342 Macular cyst, hole, or pseudohole, left eye: Secondary | ICD-10-CM | POA: Diagnosis not present

## 2024-04-12 DIAGNOSIS — H35032 Hypertensive retinopathy, left eye: Secondary | ICD-10-CM | POA: Diagnosis not present

## 2024-04-12 DIAGNOSIS — H35372 Puckering of macula, left eye: Secondary | ICD-10-CM | POA: Diagnosis not present

## 2024-04-12 DIAGNOSIS — H2511 Age-related nuclear cataract, right eye: Secondary | ICD-10-CM | POA: Diagnosis not present

## 2024-04-17 ENCOUNTER — Other Ambulatory Visit: Payer: Self-pay | Admitting: Cardiovascular Disease

## 2024-05-03 DIAGNOSIS — M810 Age-related osteoporosis without current pathological fracture: Secondary | ICD-10-CM | POA: Diagnosis not present

## 2024-05-24 DIAGNOSIS — H35352 Cystoid macular degeneration, left eye: Secondary | ICD-10-CM | POA: Diagnosis not present

## 2024-05-24 DIAGNOSIS — H35032 Hypertensive retinopathy, left eye: Secondary | ICD-10-CM | POA: Diagnosis not present

## 2024-05-24 DIAGNOSIS — H2511 Age-related nuclear cataract, right eye: Secondary | ICD-10-CM | POA: Diagnosis not present

## 2024-05-24 DIAGNOSIS — H35372 Puckering of macula, left eye: Secondary | ICD-10-CM | POA: Diagnosis not present

## 2024-05-24 DIAGNOSIS — H35342 Macular cyst, hole, or pseudohole, left eye: Secondary | ICD-10-CM | POA: Diagnosis not present

## 2024-05-24 DIAGNOSIS — H34832 Tributary (branch) retinal vein occlusion, left eye, with macular edema: Secondary | ICD-10-CM | POA: Diagnosis not present

## 2024-07-19 DIAGNOSIS — H35352 Cystoid macular degeneration, left eye: Secondary | ICD-10-CM | POA: Diagnosis not present

## 2024-07-19 DIAGNOSIS — H35342 Macular cyst, hole, or pseudohole, left eye: Secondary | ICD-10-CM | POA: Diagnosis not present

## 2024-07-19 DIAGNOSIS — H35372 Puckering of macula, left eye: Secondary | ICD-10-CM | POA: Diagnosis not present

## 2024-07-19 DIAGNOSIS — H2511 Age-related nuclear cataract, right eye: Secondary | ICD-10-CM | POA: Diagnosis not present

## 2024-07-19 DIAGNOSIS — H34832 Tributary (branch) retinal vein occlusion, left eye, with macular edema: Secondary | ICD-10-CM | POA: Diagnosis not present

## 2024-07-19 DIAGNOSIS — H35032 Hypertensive retinopathy, left eye: Secondary | ICD-10-CM | POA: Diagnosis not present

## 2024-08-10 ENCOUNTER — Other Ambulatory Visit: Payer: Self-pay | Admitting: General Practice

## 2024-08-10 MED ORDER — AMLODIPINE BESYLATE 5 MG PO TABS: 5.0000 mg | ORAL_TABLET | Freq: Every day | ORAL | 0 refills | Status: AC

## 2024-09-13 DIAGNOSIS — H35372 Puckering of macula, left eye: Secondary | ICD-10-CM | POA: Diagnosis not present

## 2024-09-13 DIAGNOSIS — H35032 Hypertensive retinopathy, left eye: Secondary | ICD-10-CM | POA: Diagnosis not present

## 2024-09-13 DIAGNOSIS — H34832 Tributary (branch) retinal vein occlusion, left eye, with macular edema: Secondary | ICD-10-CM | POA: Diagnosis not present

## 2024-09-13 DIAGNOSIS — H2511 Age-related nuclear cataract, right eye: Secondary | ICD-10-CM | POA: Diagnosis not present

## 2024-09-13 DIAGNOSIS — H35352 Cystoid macular degeneration, left eye: Secondary | ICD-10-CM | POA: Diagnosis not present

## 2024-09-13 DIAGNOSIS — H35342 Macular cyst, hole, or pseudohole, left eye: Secondary | ICD-10-CM | POA: Diagnosis not present

## 2024-10-14 DIAGNOSIS — M810 Age-related osteoporosis without current pathological fracture: Secondary | ICD-10-CM | POA: Diagnosis not present

## 2024-10-14 DIAGNOSIS — E7800 Pure hypercholesterolemia, unspecified: Secondary | ICD-10-CM | POA: Diagnosis not present

## 2024-10-19 DIAGNOSIS — I8390 Asymptomatic varicose veins of unspecified lower extremity: Secondary | ICD-10-CM | POA: Diagnosis not present

## 2024-10-19 DIAGNOSIS — G2581 Restless legs syndrome: Secondary | ICD-10-CM | POA: Diagnosis not present

## 2024-10-19 DIAGNOSIS — M810 Age-related osteoporosis without current pathological fracture: Secondary | ICD-10-CM | POA: Diagnosis not present

## 2024-10-19 DIAGNOSIS — Z23 Encounter for immunization: Secondary | ICD-10-CM | POA: Diagnosis not present

## 2024-10-19 DIAGNOSIS — Z5320 Procedure and treatment not carried out because of patient's decision for unspecified reasons: Secondary | ICD-10-CM | POA: Diagnosis not present

## 2024-10-19 DIAGNOSIS — I471 Supraventricular tachycardia, unspecified: Secondary | ICD-10-CM | POA: Diagnosis not present

## 2024-10-19 DIAGNOSIS — I10 Essential (primary) hypertension: Secondary | ICD-10-CM | POA: Diagnosis not present

## 2024-10-19 DIAGNOSIS — I2510 Atherosclerotic heart disease of native coronary artery without angina pectoris: Secondary | ICD-10-CM | POA: Diagnosis not present

## 2024-10-19 DIAGNOSIS — Z0000 Encounter for general adult medical examination without abnormal findings: Secondary | ICD-10-CM | POA: Diagnosis not present

## 2024-11-02 DIAGNOSIS — Z23 Encounter for immunization: Secondary | ICD-10-CM | POA: Diagnosis not present

## 2024-11-09 DIAGNOSIS — H35032 Hypertensive retinopathy, left eye: Secondary | ICD-10-CM | POA: Diagnosis not present

## 2024-11-09 DIAGNOSIS — H34832 Tributary (branch) retinal vein occlusion, left eye, with macular edema: Secondary | ICD-10-CM | POA: Diagnosis not present

## 2024-11-09 DIAGNOSIS — H35352 Cystoid macular degeneration, left eye: Secondary | ICD-10-CM | POA: Diagnosis not present

## 2024-11-09 DIAGNOSIS — H35342 Macular cyst, hole, or pseudohole, left eye: Secondary | ICD-10-CM | POA: Diagnosis not present

## 2024-11-09 DIAGNOSIS — H2511 Age-related nuclear cataract, right eye: Secondary | ICD-10-CM | POA: Diagnosis not present

## 2024-11-09 DIAGNOSIS — H35372 Puckering of macula, left eye: Secondary | ICD-10-CM | POA: Diagnosis not present
